# Patient Record
Sex: Female | Born: 2000 | Race: White | Hispanic: No | Marital: Single | Smoking: Never smoker
Health system: Southern US, Community
[De-identification: ages and names within clinical notes are randomized; demographics above are authoritative.]

## PROBLEM LIST (undated history)

## (undated) DIAGNOSIS — F32A Depression, unspecified: Secondary | ICD-10-CM

## (undated) DIAGNOSIS — F419 Anxiety disorder, unspecified: Secondary | ICD-10-CM

## (undated) HISTORY — PX: NO PAST SURGERIES: SHX2092

---
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
## 2021-08-14 ENCOUNTER — Ambulatory Visit: Payer: Medicaid Other | Admitting: Cardiology

## 2021-08-14 ENCOUNTER — Encounter: Payer: Self-pay | Admitting: Cardiology

## 2021-08-14 ENCOUNTER — Other Ambulatory Visit: Payer: Self-pay

## 2021-08-14 VITALS — BP 131/83 | HR 112 | Temp 98.3°F | Resp 16 | Ht 61.0 in | Wt 84.8 lb

## 2021-08-14 DIAGNOSIS — R000 Tachycardia, unspecified: Secondary | ICD-10-CM

## 2021-08-14 NOTE — Progress Notes (Signed)
Primary Physician/Referring:  Trey Sailors, PA  Patient ID: Cassandra Cruz, female    DOB: 04-04-2001, 20 y.o.   MRN: [id number]  Chief Complaint  Patient presents with   New Patient (Initial Visit)   Tachycardia   Palpitations   HPI:    Cassandra Cruz  is a 20 y.o. Caucasian female referred to me for evaluation of resting sinus tachycardia.  Patient is asymptomatic.  Physical examination does not reveal any obvious abnormalities, EKG does reveal heart rate of 99 bpm.  She is asymptomatic.  Past Medical History:  Diagnosis Date   Anxiety    Past Surgical History:  Procedure Laterality Date   NO PAST SURGERIES     STRABISMUS SURGERY Bilateral 04/08/2020   Procedure: BILATERAL STRABISMUS REPAIR PEDIATRIC;  Surgeon: Everitt Amber, MD;  Location: [place];  Service: Ophthalmology;  Laterality: Bilateral;   Family History  Problem Relation Age of Onset   Anxiety disorder Mother    Depression Mother    Post-traumatic stress disorder Mother    Anxiety disorder Father    Depression Father    Supraventricular tachycardia Sister    Anemia Sister    Asthma Brother    Anxiety disorder Maternal Aunt    Depression Maternal Aunt    Anxiety disorder Paternal Aunt    Depression Paternal Aunt    Anxiety disorder Maternal Grandmother    Depression Maternal Grandmother    Anxiety disorder Maternal Grandfather    Depression Maternal Grandfather    Anxiety disorder Paternal Grandmother     Social History   Tobacco Use   Smoking status: Never   Smokeless tobacco: Never  Substance Use Topics   Alcohol use: Never   Marital Status: Single  ROS  Review of Systems  Cardiovascular:  Negative for chest pain, dyspnea on exertion and leg swelling.  Gastrointestinal:  Negative for melena.  Objective  Blood pressure 131/83, pulse (!) 112, temperature 98.3 F (36.8 C), temperature source Temporal, resp. rate 16, height _0  (1.549 m), weight 84 lb 12.8 oz (38.5  kg), SpO2 98 %. Body mass index is 16.02 kg/m.  Vitals with BMI 08/14/2021 04/08/2020 04/08/2020  Height _1  - -  Weight 84 lbs 13 oz - -  BMI 23.36 - -  Systolic 122 449 753  Diastolic 83 73 69  Pulse 005 86 93  Some encounter information is confidential and restricted. Go to Review Flowsheets activity to see all data.    Physical Exam Constitutional:      Appearance: She is underweight.  Neck:     Vascular: No carotid bruit or JVD.  Cardiovascular:     Rate and Rhythm: Normal rate and regular rhythm.     Pulses: Intact distal pulses.     Heart sounds: Normal heart sounds. No murmur heard.   No gallop.  Pulmonary:     Effort: Pulmonary effort is normal.     Breath sounds: Normal breath sounds.  Abdominal:     General: Bowel sounds are normal.     Palpations: Abdomen is soft.  Musculoskeletal:        General: No swelling.     Laboratory examination:   No results for input(s): NA, K, CL, CO2, GLUCOSE, BUN, CREATININE, CALCIUM, GFRNONAA, GFRAA in the last 8760 hours. CrCl cannot be calculated (No successful lab value found.).  No flowsheet data found. No flowsheet data found.  Lipid Panel No results for input(s): CHOL, TRIG, LDLCALC, VLDL, HDL, CHOLHDL, LDLDIRECT in the last  8760 hours. Lipid Panel  No results found for: CHOL, TRIG, HDL, CHOLHDL, VLDL, LDLCALC, LDLDIRECT, LABVLDL   HEMOGLOBIN A1C No results found for: HGBA1C, MPG TSH No results for input(s): TSH in the last 8760 hours.  External labs:   08/02/2021:  Serum glucose 82 mg, BUN 10, creatinine 0.62, EGFR 131 mL, potassium 4.8, CMP otherwise normal.  Hb 12.2/HCT 38.5, platelets 497, elevated.  ANA negative.  Vitamin D 63.  Total cholesterol 156, triglycerides 63, HDL 57, LDL 85.  TSH 4.8, normal, FT4 normal.  Medications and allergies  No Known Allergies   Medication prior to this encounter:   No outpatient medications prior to visit.   No facility-administered medications prior to visit.     Medication list after today's encounter   No current outpatient medications  Radiology:   No results found.  Cardiac Studies:   EKG:   EKG 08/14/2021: Normal sinus rhythm at rate of 99 bpm, incomplete right bundle.  Normal EKG.    Assessment     ICD-10-CM   1. Sinus tachycardia  R00.0 EKG 12-Lead       There are no discontinued medications.  No orders of the defined types were placed in this encounter.  Orders Placed This Encounter  Procedures   EKG 12-Lead   Recommendations:   Cassandra Cruz is a 20 y.o. Caucasian female referred to me for evaluation of resting sinus tachycardia.  Patient is asymptomatic.  Physical examination does not reveal any obvious abnormalities, EKG does reveal heart rate of 99 bpm.  On further questioning, patient has been drinking caffeinated sodas frequently and also coffee frequently which she has completely quit since she was told she has sinus tachycardia.  Heart rate is improved.  In general patient has very low BMI, her thyroid panel is normal, she could try cyproheptadine for increasing her appetite.  Do not think she needs any further cardiac work-up.  Her mother has very mild coronary artery disease, but she is a smoker.  Patient's labs reviewed, she does have mildly elevated platelets but otherwise unremarkable labs including lipid profile.  For now continued observation is indicated.    Adrian Prows, MD, [facility] 08/14/2021, 12:45 PM Office: [phone]

## 2023-09-21 ENCOUNTER — Emergency Department (HOSPITAL_COMMUNITY)
Admission: EM | Admit: 2023-09-21 | Discharge: 2023-09-22 | Disposition: A | Discharge status: Other Acute Inpatient Hospital | Payer: Medicaid Other | Attending: Emergency Medicine | Admitting: Emergency Medicine

## 2023-09-21 ENCOUNTER — Encounter (HOSPITAL_COMMUNITY): Payer: Self-pay | Admitting: *Deleted

## 2023-09-21 ENCOUNTER — Other Ambulatory Visit: Payer: Self-pay

## 2023-09-21 DIAGNOSIS — R45851 Suicidal ideations: Secondary | ICD-10-CM | POA: Insufficient documentation

## 2023-09-21 DIAGNOSIS — F329 Major depressive disorder, single episode, unspecified: Secondary | ICD-10-CM | POA: Diagnosis present

## 2023-09-21 DIAGNOSIS — F332 Major depressive disorder, recurrent severe without psychotic features: Secondary | ICD-10-CM | POA: Insufficient documentation

## 2023-09-21 DIAGNOSIS — R000 Tachycardia, unspecified: Secondary | ICD-10-CM | POA: Diagnosis not present

## 2023-09-21 DIAGNOSIS — F411 Generalized anxiety disorder: Secondary | ICD-10-CM | POA: Diagnosis not present

## 2023-09-21 DIAGNOSIS — F909 Attention-deficit hyperactivity disorder, unspecified type: Secondary | ICD-10-CM | POA: Insufficient documentation

## 2023-09-21 DIAGNOSIS — T1491XA Suicide attempt, initial encounter: Secondary | ICD-10-CM

## 2023-09-21 LAB — COMPREHENSIVE METABOLIC PANEL
ALT: 18 U/L (ref 0–44)
AST: 24 U/L (ref 15–41)
Albumin: 4.3 g/dL (ref 3.5–5.0)
Alkaline Phosphatase: 148 U/L — ABNORMAL HIGH (ref 38–126)
Anion gap: 12 (ref 5–15)
BUN: 10 mg/dL (ref 6–20)
CO2: 22 mmol/L (ref 22–32)
Calcium: 9.2 mg/dL (ref 8.9–10.3)
Chloride: 104 mmol/L (ref 98–111)
Creatinine, Ser: 0.76 mg/dL (ref 0.44–1.00)
GFR, Estimated: >60 mL/min (ref >60)
Glucose, Bld: 131 mg/dL — ABNORMAL HIGH (ref 70–99)
Potassium: 3.3 mmol/L — ABNORMAL LOW (ref 3.5–5.1)
Sodium: 138 mmol/L (ref 135–145)
Total Bilirubin: 0.6 mg/dL (ref 0.0–1.2)
Total Protein: 7.9 g/dL (ref 6.5–8.1)

## 2023-09-21 LAB — CBC WITH DIFFERENTIAL/PLATELET
Abs Immature Granulocytes: 0.13 10*3/uL — ABNORMAL HIGH (ref 0.00–0.07)
Basophils Absolute: 0.1 10*3/uL (ref 0.0–0.1)
Basophils Relative: 1 %
Eosinophils Absolute: 0.1 10*3/uL (ref 0.0–0.5)
Eosinophils Relative: 1 %
HCT: 38.4 % (ref 36.0–46.0)
Hemoglobin: 11.6 g/dL — ABNORMAL LOW (ref 12.0–15.0)
Immature Granulocytes: 1 %
Lymphocytes Relative: 15 %
Lymphs Abs: 1.5 10*3/uL (ref 0.7–4.0)
MCH: 26.2 pg (ref 26.0–34.0)
MCHC: 30.2 g/dL (ref 30.0–36.0)
MCV: 86.7 fL (ref 80.0–100.0)
Monocytes Absolute: 0.5 10*3/uL (ref 0.1–1.0)
Monocytes Relative: 5 %
Neutro Abs: 7.6 10*3/uL (ref 1.7–7.7)
Neutrophils Relative %: 77 %
Platelets: 606 10*3/uL — ABNORMAL HIGH (ref 150–400)
RBC: 4.43 MIL/uL (ref 3.87–5.11)
RDW: 16.9 % — ABNORMAL HIGH (ref 11.5–15.5)
WBC: 9.9 10*3/uL (ref 4.0–10.5)
nRBC: 0 % (ref 0.0–0.2)

## 2023-09-21 LAB — RAPID URINE DRUG SCREEN, HOSP PERFORMED
Amphetamines: NOT DETECTED
Barbiturates: NOT DETECTED
Benzodiazepines: NOT DETECTED
Cocaine: NOT DETECTED
Opiates: NOT DETECTED
Tetrahydrocannabinol: NOT DETECTED

## 2023-09-21 LAB — URINALYSIS, ROUTINE W REFLEX MICROSCOPIC
Bilirubin Urine: NEGATIVE
Glucose, UA: NEGATIVE mg/dL
Ketones, ur: 5 mg/dL — AB
Nitrite: NEGATIVE
Protein, ur: 100 mg/dL — AB
Specific Gravity, Urine: 1.02 (ref 1.005–1.030)
pH: 5 (ref 5.0–8.0)

## 2023-09-21 LAB — CBG MONITORING, ED: Glucose-Capillary: 103 mg/dL — ABNORMAL HIGH (ref 70–99)

## 2023-09-21 LAB — OSMOLALITY: Osmolality: 318 mosm/kg — ABNORMAL HIGH (ref 275–295)

## 2023-09-21 LAB — SALICYLATE LEVEL: Salicylate Lvl: <7 mg/dL — ABNORMAL LOW (ref 7.0–30.0)

## 2023-09-21 LAB — ACETAMINOPHEN LEVEL
Acetaminophen (Tylenol), Serum: <10 ug/mL — ABNORMAL LOW (ref 10–30)
Acetaminophen (Tylenol), Serum: <10 ug/mL — ABNORMAL LOW (ref 10–30)

## 2023-09-21 LAB — ETHANOL: Alcohol, Ethyl (B): <10 mg/dL (ref <10)

## 2023-09-21 LAB — MAGNESIUM: Magnesium: 2.2 mg/dL (ref 1.7–2.4)

## 2023-09-21 LAB — HCG, SERUM, QUALITATIVE: Preg, Serum: NEGATIVE

## 2023-09-21 MED ORDER — SODIUM CHLORIDE 0.9 % IV BOLUS
1000.0000 mL | Freq: Once | INTRAVENOUS | Status: AC
  Administered 2023-09-21: 1000 mL via INTRAVENOUS

## 2023-09-21 MED ORDER — POTASSIUM CHLORIDE 10 MEQ/100ML IV SOLN
10.0000 meq | INTRAVENOUS | Status: AC
Start: 2023-09-21 — End: 2023-09-21
  Administered 2023-09-21 (×2): 10 meq via INTRAVENOUS
  Filled 2023-09-21: qty 100

## 2023-09-21 MED ORDER — HYDROXYZINE HCL 25 MG PO TABS
25.0000 mg | ORAL_TABLET | Freq: Three times a day (TID) | ORAL | Status: DC | PRN
Start: 2023-09-21 — End: 2023-09-22

## 2023-09-21 MED ORDER — ALUM & MAG HYDROXIDE-SIMETH 200-200-20 MG/5ML PO SUSP: 30.0000 mL | Freq: Four times a day (QID) | ORAL | Status: DC | PRN

## 2023-09-21 NOTE — ED Notes (Signed)
Recommended for inpt psych for MDD, see [facility] BH/MH note

## 2023-09-21 NOTE — ED Notes (Signed)
Mother Blair Heys [phone]  has pts belongings and would like updates

## 2023-09-21 NOTE — ED Notes (Signed)
Pt would like mother to get updates on her care

## 2023-09-21 NOTE — ED Triage Notes (Addendum)
BIB [facility] from home, lives with family, here for SI w/ attempt of drinking rubbing alcohol, with subsequent abd pain, NV. VSS. HR ST 130.CBG 183. GCS 15. Endorses SI and attempt. Awake, interactive, calm, follows commands. Emesis noted at home, NV with EMS enroute. No IV, no meds given. Protecting airway.

## 2023-09-21 NOTE — Consult Note (Signed)
[facility] Psychiatric Consult Initial  Patient Name: .Nakirah Silverstone  MRN: [id number]  DOB: 2001/07/18  Consult Order details:  Orders (From admission, onward)     Start     Ordered   09/21/23 1721  CONSULT TO CALL ACT TEAM       Ordering Provider: Smitty Knudsen, PA-C  Provider:  (Not yet assigned)  Question:  Reason for Consult?  Answer:  Psych consult, SI with attempt   09/21/23 1722             Mode of Visit: In person    Psychiatry Consult Evaluation  Service Date: September 21, 2023 LOS:  LOS: 0 days  Chief Complaint SI attempt  Primary Psychiatric Diagnoses  Major Depressive disorder 2.   Generalized anxiety disorder 3.   ADHD  Assessment  Trillium Gunnison is a 23 y.o. female admitted: Presented to the ED for 09/21/2023 12:03 PM for suicide attempt. She carries the psychiatric diagnoses of major depressive disorder, anxiety disorder, ADHD and has a past medical history of none.   Her current presentation of suicide attempt, due to racing thoughts is most consistent with depression and anxiety. She meets criteria for inpatient based on suicide attempt.  Current outpatient psychotropic medications include none. On initial examination, patient sitting up in hospital gurney, pleasant and cooperative. Please see plan below for detailed recommendations.   Diagnoses:  Active Hospital problems: Principal Problem:   GAD (generalized anxiety disorder) Active Problems:   MDD (major depressive disorder)    Plan   ## Psychiatric Medication Recommendations:  Start patient on Atarax 25 mg 3 times daily as needed for anxiety  ## Medical Decision Making Capacity:  Patient is her own legal guardian  ## Further Work-up:  -- Monitor potassium level EKG or UDS -- most recent EKG on 09/21/23 had QtC of 463 -- Pertinent labwork reviewed earlier this admission includes: CMP, BMP, UDS, EKG, K+   ## Disposition:-- We recommend inpatient psychiatric hospitalization after  medical hospitalization. Patient has been involuntarily committed on 09/21/23.   ## Behavioral / Environmental: -To minimize splitting of staff, assign one staff person to communicate all information from the team when feasible. or Utilize compassion and acknowledge the patient's experiences while setting clear and realistic expectations for care.    ## Safety and Observation Level:  - Based on my clinical evaluation, I estimate the patient to be at high risk of self harm in the current setting. - At this time, we recommend  1:1 Observation. This decision is based on my review of the chart including patient's history and current presentation, interview of the patient, mental status examination, and consideration of suicide risk including evaluating suicidal ideation, plan, intent, suicidal or self-harm behaviors, risk factors, and protective factors. This judgment is based on our ability to directly address suicide risk, implement suicide prevention strategies, and develop a safety plan while the patient is in the clinical setting. Please contact our team if there is a concern that risk level has changed.  CSSR Risk Category:C-SSRS RISK CATEGORY: High Risk  Suicide Risk Assessment: Patient has following modifiable risk factors for suicide: active suicidal ideation, untreated depression, social isolation, and medication noncompliance, which we are addressing by recommending inpatient psychiatric admission. Patient has following non-modifiable or demographic risk factors for suicide: history of suicide attempt and history of self harm behavior Patient has the following protective factors against suicide: Supportive family and Minor children in the home  Thank you for this consult request. Recommendations have been  communicated to the primary team.  We will recommend inpatient psychiatric admission and continue to follow the patient at this time.   Alona Bene, PMHNP       History of  Present Illness  Relevant Aspects of Hospital ED Course:  Admitted on 09/21/2023 for suicidal attempt.  Patient Report:  [name], 23 y.o., female patient seen face to face by this provider, consulted with Dr. Jannifer Franklin; and chart reviewed on 09/21/23.  On evaluation Emanee Tarricone reports  that she has been feeling suicidal for a long time.  She states sometimes her thoughts get the best of her, states he has been suffering from anxiety since the age of 23 years old.  States that she has started on medications was on Prozac at age 25 but only took it for a few months, in 2021 was prescribed the present medication but never took them.  She states she does not like the idea of being on medications that she did not want to start them at that time, now she says she is open to trying new medications for her increasing racing anxious thoughts.  She states she has no good days from her racing thoughts and her anxiety, no distractions help her that she has tried, states she is tired of feeling this way, so she attempted to drink rubbing alcohol and attempt to harm herself.  Patient states that she does have a history of suicide attempts states she tried to take 100 ibuprofen at the end of December 2024, but vomited them back up shortly after, did not go to a hospital or see anyone about it.  She also states that she has a history of self-harm in January 2024, where she has superficial scratches on her arms and thighs.  Patient states she has not been admitted to an inpatient psychiatric facility.  States she did receive counseling and [facility] [place], but states she did not open up and really discuss her feelings and emotions, left the practice shortly after.  She states she currently lives with her family which consists of mom, stepdad,  grandfather, older  sister, younger brother.  She states her biological father passed away years  ago due to a drug overdose.  She works part-time at [employer], she does not drive due to her anxiety.  Patient states she was beginning to have an alcohol problem, but her ex-boyfriend stopped her from drinking excessively, states she has not had any alcohol since beginning of December 2024, states she has not had any marijuana since January 2024.  UDS was negative and BAL less than 10.  She states that her appetite is good she is about 2 meals and snacks a day, her sleep she receives about 6 to 8 hours a night with no medication.  She endorses suicidal ideations with no current plan and denies HI/AVH.  She states her maternal side of the family has a lot of mental health issues.  During evaluation Kameka Bream is sitting on her hospital gurney, hair is in her face so this provider cannot see her face, she appears to be very shy but does not appear to be in any distress. She is alert, oriented x 4, calm, cooperative and attentive. Her mood is sad with congruent/flat affect. She has normal speech, and behavior.  Objectively there is no evidence of psychosis/mania or delusional thinking.  Patient is able to converse coherently, goal directed thoughts, no distractibility, or pre-occupation.  She endorses suicidal ideations with no current plan and denies HI/AVH.   Psych ROS:  Depression: Yes Anxiety: Yes Mania (lifetime and current): Denies Psychosis: (lifetime and current): Denies  Collateral information:  Attempts to contact Blair Heys patient's mother no answer we will try again.  Review of Systems  Psychiatric/Behavioral:  Positive for depression and suicidal ideas. The patient is nervous/anxious.      Psychiatric and Social History  Psychiatric History:  Information collected from patient  Prev Dx/Sx: Depression and anxiety, when younger ADHD Current Psych Provider: None Home Meds (current): None Previous Med Trials: Yes, Prozac and other antidepressants Therapy: Yes in  2021  Prior Psych Hospitalization: No Prior Self Harm: Yes Prior Violence: Denies  Family Psych History: Yes maternal side of family Family Hx suicide: Unknown  Social History:  Developmental Hx: Patient appears age appropriate Educational Hx: Patient graduated from high school Occupational Hx: Works at [employer] part-time Legal Hx: Denies Living Situation: Lives with family Spiritual Hx: Christian Access to weapons/lethal means: Patient denies  Substance History Alcohol: Yes Type of alcohol liquor Last Drink August 04, 2023 Number of drinks per day she can remember History of alcohol withdrawal seizures denies History of DT's denies Tobacco: Denies Illicit drugs: Denies Prescription drug abuse: Denies Rehab hx: Denies  Exam Findings  Physical Exam:  Vital Signs:  Temp:  [97.7 F (36.5 C)-98.4 F (36.9 C)] 98.4 F (36.9 C) (01/18 1630) Pulse Rate:  [97-132] 123 (01/18 1615) Resp:  [8-28] 15 (01/18 1615) BP: (98-128)/(55-104) 107/62 (01/18 1615) SpO2:  [95 %-100 %] 95 % (01/18 1615) Weight:  [38.5 kg] 38.5 kg (01/18 1219) Blood pressure 107/62, pulse (!) 123, temperature 98.4 F (36.9 C), temperature source Oral, resp. rate 15, height 5\' 1"  (1.549 m), weight 38.5 kg, last menstrual period 08/28/2023, SpO2 95%. Body mass index is 16.02 kg/m.  Physical Exam Vitals and nursing note reviewed. Exam conducted with a chaperone present.  Psychiatric:        Attention and Perception: Attention normal.        Mood and Affect: Mood is anxious. Affect is flat and tearful.        Speech: Speech normal.        Behavior: Behavior is cooperative.        Thought Content: Thought content includes suicidal ideation. Thought content includes suicidal plan.        Cognition and Memory: Memory normal.        Judgment: Judgment is impulsive and inappropriate.     Mental Status Exam: General Appearance: Disheveled  Orientation:  Full (Time, Place, and Person)  Memory:   Immediate;   Good Remote;   Good  Concentration:  Concentration: Fair and Attention Span: Fair  Recall:  Fair  Attention  Good  Eye Contact:  Good  Speech:  Clear and Coherent  Language:  Good  Volume:  Decreased  Mood: sad  Affect:  Congruent  Thought Process:  Coherent  Thought Content:  WDL  Suicidal Thoughts:  Yes.  with intent/plan  Homicidal Thoughts:  No  Judgement:  Fair  Insight:  Fair  Psychomotor Activity:  Normal  Akathisia:  No  Fund of Knowledge:  Fair      Assets:  Manufacturing systems engineer Desire for Improvement Financial Resources/Insurance Housing Social Support  Cognition:  WNL  ADL's:  Intact  AIMS (if indicated):        Other History   These have been pulled in through the EMR, reviewed, and updated if  appropriate.  Family History:  The patient's family history includes Anemia in her sister; Anxiety disorder in her father, maternal aunt, maternal grandfather, maternal grandmother, mother, paternal aunt, and paternal grandmother; Asthma in her brother; Depression in her father, maternal aunt, maternal grandfather, maternal grandmother, mother, and paternal aunt; Post-traumatic stress disorder in her mother; Supraventricular tachycardia in her sister.  Medical History: Past Medical History:  Diagnosis Date   Anxiety     Surgical History: Past Surgical History:  Procedure Laterality Date   NO PAST SURGERIES     STRABISMUS SURGERY Bilateral 04/08/2020   Procedure: BILATERAL STRABISMUS REPAIR PEDIATRIC;  Surgeon: Verne Carrow, MD;  Location: [facility];  Service: Ophthalmology;  Laterality: Bilateral;     Medications:   Current Facility-Administered Medications:    alum & mag hydroxide-simeth (MAALOX/MYLANTA) 200-200-20 MG/5ML suspension 30 mL, 30 mL, Oral, Q6H PRN, Barrett, Jamie N, PA-C No current outpatient medications on file.  Allergies: No Known Allergies  Travarus Trudo MOTLEY-MANGRUM, PMHNP

## 2023-09-21 NOTE — ED Notes (Signed)
TTS in progress at [facility]

## 2023-09-21 NOTE — ED Notes (Signed)
Poison control called back and was satisfied with the patients progress. Said she was clear from their standpoint.

## 2023-09-21 NOTE — ED Notes (Signed)
ED PA at [facility] 

## 2023-09-21 NOTE — ED Provider Notes (Signed)
[place] EMERGENCY DEPARTMENT AT [facility] Provider Note   CSN: [id number] Arrival date & time: 09/21/23  1202     History  Chief Complaint  Patient presents with   Suicide Attempt    Cassandra Cruz is a 23 y.o. female with history of anxiety and depression reporting to the emergency room with attempted suicide.  Patient reports that she drank half a bottle of rubbing alcohol around 930 this morning.  Patient reports she did not take any other medications.  She reports she had history of suicide attempt in the past however did not seek care after attempt.  Endorses nausea.  Reports she vomited multiple times at home prior to arriving to emergency room.  Denies any chest pain, shortness of breath or abdominal pain.  HPI     Home Medications Prior to Admission medications   Not on File      Allergies    Patient has no known allergies.    Review of Systems   Review of Systems  Gastrointestinal:  Positive for vomiting.    Physical Exam Updated Vital Signs BP 101/61   Pulse 100   Temp 97.7 F (36.5 C) (Oral)   Resp 17   Ht 5\' 1"  (1.549 m)   Wt 38.5 kg   LMP 08/28/2023 (Approximate)   SpO2 100%   BMI 16.02 kg/m  Physical Exam Vitals and nursing note reviewed.  Constitutional:      General: She is not in acute distress.    Appearance: She is not toxic-appearing.     Comments: Drowsy, but alert and oriented x 4.   HENT:     Head: Normocephalic and atraumatic.  Eyes:     General: No scleral icterus.    Conjunctiva/sclera: Conjunctivae normal.  Cardiovascular:     Rate and Rhythm: Regular rhythm. Tachycardia present.     Pulses: Normal pulses.     Heart sounds: Normal heart sounds.  Pulmonary:     Effort: Pulmonary effort is normal. No respiratory distress.     Breath sounds: Normal breath sounds.  Abdominal:     General: Abdomen is flat. Bowel sounds are normal. There is no distension.     Palpations: Abdomen is soft. There is no mass.      Tenderness: There is no abdominal tenderness.  Musculoskeletal:     Right lower leg: No edema.     Left lower leg: No edema.  Skin:    General: Skin is warm and dry.     Capillary Refill: Capillary refill takes less than 2 seconds.     Findings: No lesion.  Neurological:     General: No focal deficit present.     Mental Status: She is alert and oriented to person, place, and time. Mental status is at baseline.     Cranial Nerves: No cranial nerve deficit.     Sensory: No sensory deficit.     Motor: No weakness.     Coordination: Coordination normal.     Gait: Gait normal.     ED Results / Procedures / Treatments   Labs (all labs ordered are listed, but only abnormal results are displayed) Labs Reviewed  COMPREHENSIVE METABOLIC PANEL - Abnormal; Notable for the following components:      Result Value   Potassium 3.3 (*)    Glucose, Bld 131 (*)    Alkaline Phosphatase 148 (*)    All other components within normal limits  CBC WITH DIFFERENTIAL/PLATELET - Abnormal; Notable for the following  components:   Hemoglobin 11.6 (*)    RDW 16.9 (*)    Platelets 606 (*)    Abs Immature Granulocytes 0.13 (*)    All other components within normal limits  SALICYLATE LEVEL - Abnormal; Notable for the following components:   Salicylate Lvl <7.0 (*)    All other components within normal limits  ACETAMINOPHEN LEVEL - Abnormal; Notable for the following components:   Acetaminophen (Tylenol), Serum <10 (*)    All other components within normal limits  CBG MONITORING, ED - Abnormal; Notable for the following components:   Glucose-Capillary 103 (*)    All other components within normal limits  ETHANOL  MAGNESIUM  RAPID URINE DRUG SCREEN, HOSP PERFORMED  HCG, SERUM, QUALITATIVE  URINALYSIS, ROUTINE W REFLEX MICROSCOPIC  OSMOLALITY    EKG EKG Interpretation Date/Time:  Saturday September 21 2023 12:17:06 EST Ventricular Rate:  104 PR Interval:  147 QRS Duration:  88 QT  Interval:  399 QTC Calculation: 525 R Axis:   79  Text Interpretation: Sinus tachycardia Right atrial enlargement RSR' in V1 or V2, probably normal variant Borderline T wave abnormalities Prolonged QT interval No prior for comparison Confirmed by Vivi Barrack [phone]) on 09/21/2023 12:52:04 PM  Radiology No results found.  Procedures Procedures    Medications Ordered in ED Medications  sodium chloride 0.9 % bolus 1,000 mL (has no administration in time range)  potassium chloride 10 mEq in 100 mL IVPB (has no administration in time range)    ED Course/ Medical Decision Making/ A&P Clinical Course as of 09/21/23 1759  Sat Sep 21, 2023  1233 4 hrs repeat Acet, repeat EKG per poison control  [JB]  1552 Reassessed.  Improved presentation.  Patient no longer drowsy.  Alert and oriented answering questions appropriately.  Lungs clear to auscultation.  No abdominal pain.  Denies any nausea. [JB]    Clinical Course User Index [JB] Tabby Beaston, Horald Chestnut, PA-C                                 Medical Decision Making Amount and/or Complexity of Data Reviewed Labs: ordered.  Risk OTC drugs. Prescription drug management.   This patient presents to the ED for concern of SI after suicide  attempt, this involves an extensive number of treatment options, and is a complaint that carries with it a high risk of complications and morbidity.  The differential diagnosis includes anxiety, depression, suicide attempt, suicidal ideation, electrolyte abnormality, dehydration, medication side effect   Co morbidities that complicate the patient evaluation  Anxiety    Lab Tests:  I personally interpreted labs.  The pertinent results include:   CBC without leukocytosis, hemoglobin 11.6.  CMP with potassium of 3.3, no anion gap, alk phos is 148.  Magnesium is 2.2, salicylate under 7, acetaminophen under 10, ethanol under 10, negative hCG, normal blood glucose, negative urine rapid drug screen.  UA with  small amount of ketones, does have some amount of leukocytes and bacteria but denies dysuria or urinary tract symptoms. Repeating acetaminophen at 430 as suggested by poison control.     Cardiac Monitoring: / EKG:  The patient was maintained on a cardiac monitor.  I personally viewed and interpreted the cardiac monitored which showed an underlying rhythm of: sinus tachycardia, prolonged QT    Consultations Obtained:  I requested consultation with the BH,  and discussed lab and imaging findings as well as pertinent plan - they  recommend: Inpatient psych stay    Problem List / ED Course / Critical interventions / Medication management  Patient reporting to emergency room with intentional overdose attempt.  Patient reports that she had nausea and vomiting at home.  Upon arriving to emergency room she is tachycardic.  Patient reports she has history of chronic tachycardia and has been referred to cardiology for extensive workup for this.  Labs reassuring patient.  EKG does show sinus tachycardia with prolonged QT.  Will monitor closely and reassess including repeating EKG. On reassessment patient reports she is not having chest pain, shortness of breath, abdominal pain.  She does report she is having some nausea but declines any medication.  Has not had any episodes of vomiting since arriving to emergency room.  Continues to have sinus tachycardia.  Will recheck acetaminophen level.  She currently has no complaints and has had improvement in drowsiness.  Repeat acetaminophen within normal limits.  Repeat EKG shows sinus tachycardia at 105.  And no longer having QT prolongation.  Patient has been observed in the emergency room for 6 hours as suggested by poison control, medically cleared for [facility] evaluation.  I ordered medication including normal saline, potassium Reevaluation of the patient after these medicines showed that the patient improved I have reviewed the patients home medicines and have  made adjustments as needed   Plan  Patient under IVC for SI Diet ordered.  Patient has no at home medications. Admit for suicide attempt, suicidal ideation        Final Clinical Impression(s) / ED Diagnoses Final diagnoses:  Suicide attempt [facility])    Rx / DC Orders ED Discharge Orders     None         Smitty Knudsen, PA-C 09/21/23 1856    Loetta Rough, MD 09/22/23 [phone]

## 2023-09-21 NOTE — ED Notes (Signed)
EDP at BS 

## 2023-09-22 ENCOUNTER — Inpatient Hospital Stay (HOSPITAL_COMMUNITY)
Admission: AD | Admit: 2023-09-22 | Discharge: 2023-09-30 | DRG: 885 | Disposition: A | Discharge status: Home / Self Care | Payer: Medicaid Other | Source: Intra-hospital | Attending: Psychiatry | Admitting: Psychiatry

## 2023-09-22 ENCOUNTER — Encounter (HOSPITAL_COMMUNITY): Payer: Self-pay | Admitting: Nurse Practitioner

## 2023-09-22 ENCOUNTER — Other Ambulatory Visit: Payer: Self-pay

## 2023-09-22 DIAGNOSIS — F332 Major depressive disorder, recurrent severe without psychotic features: Principal | ICD-10-CM | POA: Diagnosis present

## 2023-09-22 DIAGNOSIS — T50902A Poisoning by unspecified drugs, medicaments and biological substances, intentional self-harm, initial encounter: Secondary | ICD-10-CM | POA: Diagnosis not present

## 2023-09-22 DIAGNOSIS — F909 Attention-deficit hyperactivity disorder, unspecified type: Secondary | ICD-10-CM | POA: Diagnosis present

## 2023-09-22 DIAGNOSIS — I951 Orthostatic hypotension: Secondary | ICD-10-CM | POA: Diagnosis not present

## 2023-09-22 DIAGNOSIS — F4010 Social phobia, unspecified: Secondary | ICD-10-CM | POA: Diagnosis present

## 2023-09-22 DIAGNOSIS — Z818 Family history of other mental and behavioral disorders: Secondary | ICD-10-CM

## 2023-09-22 DIAGNOSIS — F429 Obsessive-compulsive disorder, unspecified: Secondary | ICD-10-CM | POA: Insufficient documentation

## 2023-09-22 DIAGNOSIS — F4310 Post-traumatic stress disorder, unspecified: Secondary | ICD-10-CM | POA: Diagnosis present

## 2023-09-22 DIAGNOSIS — Z79899 Other long term (current) drug therapy: Secondary | ICD-10-CM | POA: Diagnosis not present

## 2023-09-22 DIAGNOSIS — Z825 Family history of asthma and other chronic lower respiratory diseases: Secondary | ICD-10-CM

## 2023-09-22 DIAGNOSIS — Z681 Body mass index (BMI) 19 or less, adult: Secondary | ICD-10-CM | POA: Diagnosis not present

## 2023-09-22 DIAGNOSIS — Z8659 Personal history of other mental and behavioral disorders: Secondary | ICD-10-CM

## 2023-09-22 DIAGNOSIS — Z9151 Personal history of suicidal behavior: Secondary | ICD-10-CM | POA: Diagnosis not present

## 2023-09-22 DIAGNOSIS — F411 Generalized anxiety disorder: Secondary | ICD-10-CM | POA: Diagnosis present

## 2023-09-22 DIAGNOSIS — T39312D Poisoning by propionic acid derivatives, intentional self-harm, subsequent encounter: Secondary | ICD-10-CM

## 2023-09-22 DIAGNOSIS — F410 Panic disorder [episodic paroxysmal anxiety] without agoraphobia: Secondary | ICD-10-CM | POA: Diagnosis present

## 2023-09-22 LAB — TSH: TSH: 0.773 u[IU]/mL (ref 0.350–4.500)

## 2023-09-22 LAB — VITAMIN D 25 HYDROXY (VIT D DEFICIENCY, FRACTURES): Vit D, 25-Hydroxy: 6.36 ng/mL — ABNORMAL LOW (ref 30–100)

## 2023-09-22 MED ORDER — HALOPERIDOL LACTATE 5 MG/ML IJ SOLN: 5.0000 mg | Freq: Three times a day (TID) | INTRAMUSCULAR | Status: DC | PRN

## 2023-09-22 MED ORDER — LORAZEPAM 2 MG/ML IJ SOLN: 2.0000 mg | Freq: Three times a day (TID) | INTRAMUSCULAR | Status: DC | PRN

## 2023-09-22 MED ORDER — PROPRANOLOL HCL 10 MG PO TABS
5.0000 mg | ORAL_TABLET | Freq: Once | ORAL | Status: AC
  Administered 2023-09-22: 5 mg via ORAL
  Filled 2023-09-22: qty 1
  Filled 2023-09-22: qty 0.5

## 2023-09-22 MED ORDER — DIPHENHYDRAMINE HCL 25 MG PO CAPS: 50.0000 mg | ORAL_CAPSULE | Freq: Three times a day (TID) | ORAL | Status: DC | PRN

## 2023-09-22 MED ORDER — HALOPERIDOL LACTATE 5 MG/ML IJ SOLN: 10.0000 mg | Freq: Three times a day (TID) | INTRAMUSCULAR | Status: DC | PRN

## 2023-09-22 MED ORDER — HYDROXYZINE HCL 25 MG PO TABS: 25.0000 mg | ORAL_TABLET | Freq: Three times a day (TID) | ORAL | Status: DC | PRN

## 2023-09-22 MED ORDER — PROPRANOLOL HCL 10 MG PO TABS: 5.0000 mg | ORAL_TABLET | Freq: Two times a day (BID) | ORAL | Status: DC

## 2023-09-22 MED ORDER — SERTRALINE HCL 25 MG PO TABS
25.0000 mg | ORAL_TABLET | Freq: Every day | ORAL | Status: AC
  Administered 2023-09-22: 25 mg via ORAL
  Filled 2023-09-22 (×2): qty 1

## 2023-09-22 MED ORDER — MAGNESIUM HYDROXIDE 400 MG/5ML PO SUSP: 30.0000 mL | Freq: Every day | ORAL | Status: DC | PRN

## 2023-09-22 MED ORDER — ALUM & MAG HYDROXIDE-SIMETH 200-200-20 MG/5ML PO SUSP: 30.0000 mL | ORAL | Status: DC | PRN

## 2023-09-22 MED ORDER — HALOPERIDOL 5 MG PO TABS: 5.0000 mg | ORAL_TABLET | Freq: Three times a day (TID) | ORAL | Status: DC | PRN

## 2023-09-22 MED ORDER — DIPHENHYDRAMINE HCL 50 MG/ML IJ SOLN: 50.0000 mg | Freq: Three times a day (TID) | INTRAMUSCULAR | Status: DC | PRN

## 2023-09-22 MED ORDER — ACETAMINOPHEN 325 MG PO TABS: 650.0000 mg | ORAL_TABLET | Freq: Four times a day (QID) | ORAL | Status: DC | PRN

## 2023-09-22 MED ORDER — TRAZODONE HCL 50 MG PO TABS
50.0000 mg | ORAL_TABLET | Freq: Every evening | ORAL | Status: DC | PRN
  Administered 2023-09-22 – 2023-09-27 (×2): 50 mg via ORAL
  Filled 2023-09-22 (×4): qty 1

## 2023-09-22 MED ORDER — PROPRANOLOL HCL 10 MG PO TABS
5.0000 mg | ORAL_TABLET | Freq: Two times a day (BID) | ORAL | Status: DC
  Administered 2023-09-23 – 2023-09-24 (×3): 5 mg via ORAL
  Filled 2023-09-22 (×4): qty 0.5
  Filled 2023-09-22: qty 1
  Filled 2023-09-22 (×3): qty 0.5

## 2023-09-22 MED ORDER — MIRTAZAPINE 7.5 MG PO TABS
7.5000 mg | ORAL_TABLET | Freq: Every day | ORAL | Status: DC
  Administered 2023-09-22 – 2023-09-23 (×2): 7.5 mg via ORAL
  Filled 2023-09-22 (×5): qty 1

## 2023-09-22 MED ORDER — SERTRALINE HCL 50 MG PO TABS
50.0000 mg | ORAL_TABLET | Freq: Every day | ORAL | Status: DC
  Administered 2023-09-23 – 2023-09-24 (×2): 50 mg via ORAL
  Filled 2023-09-22 (×5): qty 1

## 2023-09-22 NOTE — ED Notes (Signed)
[employer] called for transport 

## 2023-09-22 NOTE — Tx Team (Signed)
Initial Treatment Plan 09/22/2023 1:51 PM Cassandra Cruz [id number]    PATIENT STRESSORS: Marital or family conflict     PATIENT STRENGTHS: Motivation for treatment/growth  Supportive family/friends    PATIENT IDENTIFIED PROBLEMS: " My anxiety"  " My mood swings".                   DISCHARGE CRITERIA:  Improved stabilization in mood, thinking, and/or behavior Motivation to continue treatment in a less acute level of care Verbal commitment to aftercare and medication compliance  PRELIMINARY DISCHARGE PLAN: Outpatient therapy Return to previous living arrangement  PATIENT/FAMILY INVOLVEMENT: This treatment plan has been presented to and reviewed with the patient, Cassandra Cruz.  The patient  has been given the opportunity to ask questions and make suggestions.  Rosita Fire, RN 09/22/2023, 1:51 PM

## 2023-09-22 NOTE — Plan of Care (Signed)
  Problem: Education: Goal: Knowledge of [place] Education information/materials will improve Outcome: Progressing   Problem: Safety: Goal: Periods of time without injury will increase Outcome: Progressing   

## 2023-09-22 NOTE — [facility] Group Notes (Signed)
Psychoeducational Group Note  Date:  09/22/2023 Time:  2000  Group Topic/Focus:  Wrap up group  Participation Level: Did Not Attend  Participation Quality:  Not Applicable  Affect:  Not Applicable  Cognitive:  Not Applicable  Insight:  Not Applicable  Engagement in Group: Not Applicable  Additional Comments:  Did not attend.   Johann Capers S 09/22/2023, 10:12 PM

## 2023-09-22 NOTE — Progress Notes (Signed)
[facility] LCSW Progress Note   09/22/2023    9:53 AM  Cassandra Cruz   [id number]   Type of Contact and Topic:  Psychiatric Bed Placement   Pt accepted to [facility] 400-2    Patient meets inpatient criteria per Alona Bene, [facility]   The attending provider will be Dr. Sherron Flemings   Call report to [phone]    Mateo Flow, RN @ [facility] ED notified.     Pt scheduled  to arrive at [facility] TODAY. The bed is currently available.    Damita Dunnings, MSW, LCSW-A  9:54 AM 09/22/2023

## 2023-09-22 NOTE — Progress Notes (Addendum)
Patient is a 23 year old female admitted under IVC to unit  from [facility] ED post suicidal attempt via drinking 1/2 bottle of rubbing alcohol. Upon admission assessment, pt stated, " I've been having suicidal thoughts for a few months now so I drank rubbing alcohol because I was trying to kill myself". Pt also stated, " I have real bad anxiety and racing thoughts all the time I broke up with my boyfriend because the relationship was making it worse". Patient endorsed current suicidal thoughts with no plan and verbally agreed not to harm herself. Pt denied HI and AVH. Skin and safety check completed. Pt noted to have self inflicted healed cuts to her RLE and her LUE. No contraband found. Patient escorted on unit by RN and orientation provided with verbalization of understanding. Plan of Care initiated. Safety maintained.  09/22/23 1130  Psych Admission Type (Psych Patients Only)  Admission Status Involuntary  Psychosocial Assessment  Patient Complaints Anxiety;Depression  Eye Contact Brief  Facial Expression Flat;Sad  Affect Anxious;Depressed  Speech Logical/coherent  Interaction Assertive  Motor Activity Slow  Appearance/Hygiene In scrubs  Behavior Characteristics Cooperative;Anxious  Mood Depressed;Anxious  Thought Process  Coherency WDL  Content WDL  Delusions None reported or observed  Perception WDL  Hallucination None reported or observed  Judgment WDL  Confusion WDL  Danger to Self  Current suicidal ideation? Passive  Agreement Not to Harm Self Yes  Description of Agreement Verbal  Danger to Others  Danger to Others None reported or observed

## 2023-09-22 NOTE — H&P (Signed)
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 Term Goals: Ability to identify changes in lifestyle to reduce recurrence of condition will improve, Ability to verbalize feelings will improve, Ability to disclose and discuss suicidal ideas, Ability to demonstrate self-control will improve, Ability to identify and develop effective coping behaviors will improve, Ability to maintain clinical measurements within normal limits will improve, Compliance with prescribed medications will improve, and Ability to identify triggers associated with substance abuse/mental health issues will  improve             -- Long Term Goals: Improvement in symptoms so as ready for discharge                3. Medical Issues Being Addressed:              -- none     -- Continue PRN's: Tylenol, Maalox, Milk of Magnesia   4. Routine and other pertinent labs reviewed: EKG monitoring: QTc: 463 UDS: Negative Ethanol: Negative CBC: Hemoglobin 11.6 CMP: Mildly elevated alk phos 148 (probably physiologic, follow-up PCP), potassium 3.3 (corrected in ED) hCG serum: Negative acetaminophen: Negative multiple times Salicylate level: Negative Osmolality: 318   Metabolism / endocrine: BMI: There is no height or weight on file to calculate BMI. Prolactin: No results found for: "PROLACTIN" Lipid Panel: No results found for: "CHOL", "TRIG", "HDL", "CHOLHDL", "VLDL", "LDLCALC" HbgA1c: No results found for: "HGBA1C" TSH: No results found for: "TSH"  Labs to order: TSH, vitamin D (medical rule outs / inform treatment)  5. Discharge Planning:              -- Social work and case management to assist with discharge planning and identification of hospital follow-up needs prior to discharge             --  Estimated LOS: 09/29/23             -- Discharge Concerns: Need to establish a safety plan; Medication compliance and effectiveness             -- Discharge Goals: Return home with outpatient referrals for mental health follow-up including medication management/psychotherapy    I certify that inpatient services furnished can reasonably be expected to improve the patient's condition.   This note was created using a voice recognition software as a result there may be grammatical errors inadvertently enclosed that do not reflect the nature of this encounter. Every attempt is made to correct such errors.   Meryl Dare, MD PGY-1 Psychiatry Resident 09/22/2023, 1:17 PM

## 2023-09-22 NOTE — ED Notes (Signed)
Report given to [facility] at [facility]

## 2023-09-22 NOTE — Plan of Care (Signed)
  Problem: Education: Goal: Knowledge of [facility] Education information/materials will improve Outcome: Progressing Goal: Emotional status will improve Outcome: Progressing Goal: Mental status will improve Outcome: Progressing Goal: Verbalization of understanding the information provided will improve Outcome: Progressing   Problem: Activity: Goal: Interest or engagement in activities will improve Outcome: Progressing   

## 2023-09-22 NOTE — Progress Notes (Signed)
   09/22/23 2300  Psych Admission Type (Psych Patients Only)  Admission Status Involuntary  Psychosocial Assessment  Patient Complaints Anxiety;Depression;Isolation  Eye Contact Brief  Facial Expression Flat;Sad  Affect Anxious  Speech Logical/coherent  Interaction Assertive  Motor Activity Other (Comment) (WNL)  Appearance/Hygiene In scrubs  Behavior Characteristics Cooperative  Mood Depressed;Anxious  Thought Process  Coherency WDL  Content WDL  Delusions None reported or observed  Perception WDL  Hallucination None reported or observed  Judgment WDL  Confusion WDL  Danger to Self  Current suicidal ideation? Denies  Self-Injurious Behavior Obsessive/ruminative self-injurious and active non-lethal gesturing observed or expressed   Agreement Not to Harm Self Yes  Description of Agreement Verbal  Danger to Others  Danger to Others None reported or observed
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
## 2023-09-23 ENCOUNTER — Encounter (HOSPITAL_COMMUNITY): Payer: Self-pay

## 2023-09-23 DIAGNOSIS — Z8659 Personal history of other mental and behavioral disorders: Secondary | ICD-10-CM

## 2023-09-23 DIAGNOSIS — F429 Obsessive-compulsive disorder, unspecified: Secondary | ICD-10-CM | POA: Insufficient documentation

## 2023-09-23 MED ORDER — HYDROXYZINE HCL 25 MG PO TABS
25.0000 mg | ORAL_TABLET | Freq: Three times a day (TID) | ORAL | Status: DC
  Administered 2023-09-23 – 2023-09-24 (×2): 25 mg via ORAL
  Filled 2023-09-23 (×8): qty 1

## 2023-09-23 MED ORDER — VITAMIN D (ERGOCALCIFEROL) 1.25 MG (50000 UNIT) PO CAPS
50000.0000 [IU] | ORAL_CAPSULE | ORAL | Status: DC
  Administered 2023-09-23: 50000 [IU] via ORAL
  Filled 2023-09-23 (×2): qty 1

## 2023-09-23 MED ORDER — VITAMIN D3 25 MCG PO TABS
2000.0000 [IU] | ORAL_TABLET | Freq: Every day | ORAL | Status: DC
  Administered 2023-09-23 – 2023-09-30 (×8): 2000 [IU] via ORAL
  Filled 2023-09-23 (×9): qty 2

## 2023-09-23 NOTE — BH IP Treatment Plan (Signed)
Interdisciplinary Treatment and Diagnostic Plan Update  09/23/2023 Time of Session: 10:35AM Cassandra Cruz MRN: [id number]  Principal Diagnosis: MDD (major depressive disorder), recurrent severe, without psychosis (HCC)  Secondary Diagnoses: Principal Problem:   MDD (major depressive disorder), recurrent severe, without psychosis (HCC) Active Problems:   GAD (generalized anxiety disorder)   Intentional overdose (HCC)   OCD (obsessive compulsive disorder)   History of ADHD   Current Medications:  Current Facility-Administered Medications  Medication Dose Route Frequency Provider Last Rate Last Admin   acetaminophen (TYLENOL) tablet 650 mg  650 mg Oral Q6H PRN Bobbitt, Shalon E, NP       alum & mag hydroxide-simeth (MAALOX/MYLANTA) 200-200-20 MG/5ML suspension 30 mL  30 mL Oral Q4H PRN Bobbitt, Shalon E, NP       haloperidol (HALDOL) tablet 5 mg  5 mg Oral TID PRN Bobbitt, Shalon E, NP       And   diphenhydrAMINE (BENADRYL) capsule 50 mg  50 mg Oral TID PRN Bobbitt, Shalon E, NP       haloperidol lactate (HALDOL) injection 5 mg  5 mg Intramuscular TID PRN Bobbitt, Shalon E, NP       And   diphenhydrAMINE (BENADRYL) injection 50 mg  50 mg Intramuscular TID PRN Bobbitt, Shalon E, NP       And   LORazepam (ATIVAN) injection 2 mg  2 mg Intramuscular TID PRN Bobbitt, Shalon E, NP       haloperidol lactate (HALDOL) injection 10 mg  10 mg Intramuscular TID PRN Bobbitt, Shalon E, NP       And   diphenhydrAMINE (BENADRYL) injection 50 mg  50 mg Intramuscular TID PRN Bobbitt, Shalon E, NP       And   LORazepam (ATIVAN) injection 2 mg  2 mg Intramuscular TID PRN Bobbitt, Shalon E, NP       hydrOXYzine (ATARAX) tablet 25 mg  25 mg Oral TID PRN Bobbitt, Shalon E, NP       magnesium hydroxide (MILK OF MAGNESIA) suspension 30 mL  30 mL Oral Daily PRN Bobbitt, Shalon E, NP       mirtazapine (REMERON) tablet 7.5 mg  7.5 mg Oral QHS Meryl Dare, MD   7.5 mg at 09/22/23 2133   propranolol  (INDERAL) tablet 5 mg  5 mg Oral BID Meryl Dare, MD   5 mg at 09/23/23 1012   sertraline (ZOLOFT) tablet 50 mg  50 mg Oral Daily Meryl Dare, MD   50 mg at 09/23/23 1012   traZODone (DESYREL) tablet 50 mg  50 mg Oral QHS PRN Bobbitt, Shalon E, NP   50 mg at 09/22/23 2133   PTA Medications: No medications prior to admission.    Patient Stressors: Marital or family conflict    Patient Strengths: Motivation for treatment/growth  Supportive family/friends   Treatment Modalities: Medication Management, Group therapy, Case management,  1 to 1 session with clinician, Psychoeducation, Recreational therapy.   Physician Treatment Plan for Primary Diagnosis: MDD (major depressive disorder), recurrent severe, without psychosis (HCC) Long Term Goal(s):     Short Term Goals:    Medication Management: Evaluate patient's response, side effects, and tolerance of medication regimen.  Therapeutic Interventions: 1 to 1 sessions, Unit Group sessions and Medication administration.  Evaluation of Outcomes: Not Progressing  Physician Treatment Plan for Secondary Diagnosis: Principal Problem:   MDD (major depressive disorder), recurrent severe, without psychosis (HCC) Active Problems:   GAD (generalized anxiety disorder)   Intentional overdose (HCC)   OCD (  obsessive compulsive disorder)   History of ADHD  Long Term Goal(s):     Short Term Goals:       Medication Management: Evaluate patient's response, side effects, and tolerance of medication regimen.  Therapeutic Interventions: 1 to 1 sessions, Unit Group sessions and Medication administration.  Evaluation of Outcomes: Not Progressing   RN Treatment Plan for Primary Diagnosis: MDD (major depressive disorder), recurrent severe, without psychosis (HCC) Long Term Goal(s): Knowledge of disease and therapeutic regimen to maintain health will improve  Short Term Goals: Ability to remain free from injury will improve, Ability to verbalize  frustration and anger appropriately will improve, Ability to demonstrate self-control, Ability to participate in decision making will improve, Ability to verbalize feelings will improve, Ability to disclose and discuss suicidal ideas, Ability to identify and develop effective coping behaviors will improve, and Compliance with prescribed medications will improve  Medication Management: RN will administer medications as ordered by provider, will assess and evaluate patient's response and provide education to patient for prescribed medication. RN will report any adverse and/or side effects to prescribing provider.  Therapeutic Interventions: 1 on 1 counseling sessions, Psychoeducation, Medication administration, Evaluate responses to treatment, Monitor vital signs and CBGs as ordered, Perform/monitor CIWA, COWS, AIMS and Fall Risk screenings as ordered, Perform wound care treatments as ordered.  Evaluation of Outcomes: Not Progressing   LCSW Treatment Plan for Primary Diagnosis: MDD (major depressive disorder), recurrent severe, without psychosis (HCC) Long Term Goal(s): Safe transition to appropriate next level of care at discharge, Engage patient in therapeutic group addressing interpersonal concerns.  Short Term Goals: Engage patient in aftercare planning with referrals and resources, Increase social support, Increase ability to appropriately verbalize feelings, Increase emotional regulation, Facilitate acceptance of mental health diagnosis and concerns, Facilitate patient progression through stages of change regarding substance use diagnoses and concerns, Identify triggers associated with mental health/substance abuse issues, and Increase skills for wellness and recovery  Therapeutic Interventions: Assess for all discharge needs, 1 to 1 time with Social worker, Explore available resources and support systems, Assess for adequacy in community support network, Educate family and significant other(s) on  suicide prevention, Complete Psychosocial Assessment, Interpersonal group therapy.  Evaluation of Outcomes: Not Progressing   Progress in Treatment: Attending groups: No. Participating in groups: No. Taking medication as prescribed: Yes. Toleration medication: Yes. Family/Significant other contact made: No, will contact:  Blair Heys [phone] (mom) Patient understands diagnosis: Yes. Discussing patient identified problems/goals with staff: Yes. Medical problems stabilized or resolved: Yes. Denies suicidal/homicidal ideation: Yes. Issues/concerns per patient self-inventory: No.   New problem(s) identified: No, Describe:  none  New Short Term/Long Term Goal(s): medication stabilization, elimination of SI thoughts, development of comprehensive mental wellness plan.    Patient Goals:  "Lessen my suicidal thoughts and mood swings, get on medications that work"  Discharge Plan or Barriers: Patient recently admitted. CSW will continue to follow and assess for appropriate referrals and possible discharge planning.    Reason for Continuation of Hospitalization: Anxiety Depression Medication stabilization Suicidal ideation  Estimated Length of Stay: 5-7 days  Last 3 [place] Suicide Severity Risk Score: Flowsheet Row [facility] (Current) [facility] from 09[facility]21/2023 in [facility]  C-SSRS RISK CATEGORY High Risk High Risk       Last PHQ 2/9 Scores:    06/05/2018    2:12 PM  Depression screen PHQ 2/9  Decreased Interest 1  Down, Depressed, Hopeless 2  PHQ - 2 Score 3  Altered sleeping 3  Tired, decreased energy 3  Change in appetite 3  Feeling bad or failure about yourself  0  Trouble concentrating 2  Moving slowly or fidgety/restless 2  Suicidal thoughts 0  PHQ-9 Score 16    Scribe for Treatment Team: Kathi Der, LCSWA 09/23/2023 2:01 PM

## 2023-09-23 NOTE — Group Note (Signed)
Recreation Therapy Group Note   Group Topic:Communication  Group Date: 09/23/2023 Start Time: 0933 End Time: 0955 Facilitators: Tanieka Pownall-McCall, LRT,CTRS Location: [address] Dayroom   Group Topic: Communication, Problem Solving   Goal Area(s) Addresses:  Patient will effectively listen to complete activity.  Patient will identify communication skills used to make activity successful.  Patient will identify how skills used during activity can be used to reach post d/c goals.    Intervention: Building surveyor Activity - Geometric pattern cards, pencils, blank paper    Activity: Geometric Drawings.  Three volunteers from the peer group will be shown an abstract picture with a particular arrangement of geometrical shapes.  Each round, one 'speaker' will describe the pattern, as accurately as possible without revealing the image to the group.  The remaining group members will listen and draw the picture to reflect how it is described to them. Patients with the role of 'listener' cannot ask clarifying questions but, may request that the speaker repeat a direction. Once the drawings are complete, the presenter will show the rest of the group the picture and compare how close each person came to drawing the picture. LRT will facilitate a post-activity discussion regarding effective communication and the importance of planning, listening, and asking for clarification in daily interactions with others.  Education: Environmental consultant, Active listening, Support systems, Discharge planning  Education Outcome: Acknowledges understanding/In group clarification offered/Needs additional education.    Affect/Mood: N/A   Participation Level: Did not attend    Clinical Observations/Individualized Feedback:     Plan: Continue to engage patient in RT group sessions 2-3x/week.   Ashar Lewinski-McCall, LRT,CTRS 09/23/2023 11:46 AM

## 2023-09-23 NOTE — [facility] Group Notes (Signed)
[facility] Group Notes:  (Nursing/MHT/Case Management/Adjunct)  Date:  09/23/2023  Time:  9:43 PM  Type of Therapy:  Psychoeducational Skills  Participation Level:  Did Not Attend  Participation Quality:  Resistant  Affect:  Resistant  Cognitive:  Lacking  Insight:  None  Engagement in Group:  None  Modes of Intervention:  Education  Summary of Progress/Problems: The patient did not attend group this evening.   Westly Pam 09/23/2023, 9:43 PM
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
## 2023-09-23 NOTE — Plan of Care (Signed)
  Problem: Education: Goal: Knowledge of [facility] Education information/materials will improve Outcome: Progressing Goal: Emotional status will improve Outcome: Progressing Goal: Verbalization of understanding the information provided will improve Outcome: Progressing   
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
## 2023-09-23 NOTE — Progress Notes (Signed)
   09/23/23 0930  Psych Admission Type (Psych Patients Only)  Admission Status Involuntary  Psychosocial Assessment  Eye Contact Brief  Facial Expression Flat;Sad  Affect Anxious  Speech Logical/coherent  Interaction Assertive  Motor Activity Other (Comment) (WDL)  Appearance/Hygiene In scrubs  Thought Process  Coherency WDL  Content WDL  Delusions None reported or observed  Perception WDL  Hallucination None reported or observed  Judgment WDL  Confusion WDL  Danger to Self  Current suicidal ideation? Denies  Agreement Not to Harm Self Yes  Description of Agreement Verbal  Danger to Others  Danger to Others None reported or observed

## 2023-09-24 DIAGNOSIS — F332 Major depressive disorder, recurrent severe without psychotic features: Secondary | ICD-10-CM

## 2023-09-24 MED ORDER — SERTRALINE HCL 100 MG PO TABS
100.0000 mg | ORAL_TABLET | Freq: Every day | ORAL | Status: DC
  Administered 2023-09-25 – 2023-09-30 (×6): 100 mg via ORAL
  Filled 2023-09-24 (×7): qty 1

## 2023-09-24 MED ORDER — MIRTAZAPINE 15 MG PO TABS
15.0000 mg | ORAL_TABLET | Freq: Every day | ORAL | Status: DC
  Administered 2023-09-24 – 2023-09-29 (×6): 15 mg via ORAL
  Filled 2023-09-24 (×9): qty 1

## 2023-09-24 MED ORDER — HYDROXYZINE HCL 25 MG PO TABS
25.0000 mg | ORAL_TABLET | Freq: Three times a day (TID) | ORAL | Status: DC | PRN
Start: 2023-09-24 — End: 2023-09-30
  Administered 2023-09-25: 25 mg via ORAL
  Filled 2023-09-24 (×3): qty 1

## 2023-09-24 NOTE — Group Note (Signed)
Date:  09/24/2023 Time:  9:34 AM  Group Topic/Focus:  Goals Group:   The focus of this group is to help patients establish daily goals to achieve during treatment and discuss how the patient can incorporate goal setting into their daily lives to aide in recovery. Orientation:   The focus of this group is to educate the patient on the purpose and policies of crisis stabilization and provide a format to answer questions about their admission.  The group details unit policies and expectations of patients while admitted.    Participation Level:  Active  Participation Quality:  Appropriate  Affect:  Appropriate  Cognitive:  Appropriate  Insight: Appropriate  Engagement in Group:  Developing/Improving  Modes of Intervention:  Discussion  Additional Comments:    Yuki Purves D Kilan Banfill 09/24/2023, 9:34 AM

## 2023-09-24 NOTE — Plan of Care (Signed)
  Problem: Education: Goal: Knowledge of [facility] Education information/materials will improve Outcome: Progressing Goal: Emotional status will improve Outcome: Progressing Goal: Mental status will improve Outcome: Progressing Goal: Verbalization of understanding the information provided will improve Outcome: Progressing   

## 2023-09-24 NOTE — Plan of Care (Signed)
  Problem: Education: Goal: Emotional status will improve Outcome: Progressing   Problem: Education: Goal: Mental status will improve Outcome: Progressing   Problem: Activity: Goal: Interest or engagement in activities will improve Outcome: Progressing Goal: Sleeping patterns will improve Outcome: Progressing   Problem: Coping: Goal: Ability to verbalize frustrations and anger appropriately will improve Outcome: Progressing Goal: Ability to demonstrate self-control will improve Outcome: Progressing   Problem: Safety: Goal: Periods of time without injury will increase Outcome: Progressing

## 2023-09-24 NOTE — Progress Notes (Signed)
   09/24/23 0840  Psych Admission Type (Psych Patients Only)  Admission Status Involuntary  Psychosocial Assessment  Patient Complaints Anxiety;Depression  Eye Contact Brief  Facial Expression Anxious;Flat;Sad  Affect Anxious;Depressed  Speech Logical/coherent  Interaction Assertive  Motor Activity Other (Comment) (WNL)  Appearance/Hygiene In scrubs  Behavior Characteristics Cooperative;Anxious  Mood Anxious;Depressed  Thought Process  Coherency WDL  Content WDL  Delusions None reported or observed  Perception WDL  Hallucination None reported or observed  Judgment WDL  Confusion WDL  Danger to Self  Current suicidal ideation? Denies  Agreement Not to Harm Self Yes  Description of Agreement Verbal
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
## 2023-09-24 NOTE — [facility] Group Notes (Signed)
[facility] Group Notes:  (Nursing/MHT/Case Management/Adjunct)  Date:  09/24/2023  Time:  10:07 PM  Type of Therapy:  Psychoeducational Skills  Participation Level:  Minimal  Participation Quality:  Appropriate  Affect:  Blunted  Cognitive:  Appropriate  Insight:  Improving  Engagement in Group:  Improving  Modes of Intervention:  Education  Summary of Progress/Problems: The patient rated her day as a 5 out of 10 since she felt that her anxiety was the same today. Patient states that she attended all of the groups today. Her goal for tomorrow is to attend all of the groups .   Cassandra Cruz S 09/24/2023, 10:07 PM

## 2023-09-24 NOTE — Group Note (Signed)
Recreation Therapy Group Note   Group Topic:Animal Assisted Therapy   Group Date: 09/24/2023 Start Time: 3875 End Time: 1030 Facilitators: Xochilt Conant-McCall, LRT,CTRS Location: [address] Dayroom   Animal-Assisted Activity (AAA) Program Checklist/Progress Notes Patient Eligibility Criteria Checklist & Daily Group note for Rec Tx Intervention  AAA/T Program Assumption of Risk Form signed by Patient/ or Parent Legal Guardian Yes  Patient is free of allergies or severe asthma Yes  Patient reports no fear of animals Yes  Patient reports no history of cruelty to animals Yes  Patient understands his/her participation is voluntary Yes  Patient washes hands before animal contact Yes  Patient washes hands after animal contact Yes  Education: Hand Washing, Appropriate Animal Interaction   Education Outcome: Acknowledges education.    Affect/Mood: Flat   Participation Level: Active   Participation Quality: Independent   Behavior: Appropriate   Speech/Thought Process: Focused   Insight: Good   Judgement: Good   Modes of Intervention: Teaching laboratory technician   Patient Response to Interventions:  Engaged   Education Outcome:  In group clarification offered    Clinical Observations/Individualized Feedback: Pt was quiet but engaged with the therapy dog team. Pt also had minimal interaction with peers.     Plan: Continue to engage patient in RT group sessions 2-3x/week.   Babe Anthis-McCall, LRT,CTRS 09/24/2023 1:17 PM

## 2023-09-24 NOTE — Group Note (Signed)
LCSW Group Therapy Note   Group Date: 09/24/2023 Start Time: 1100 End Time: 1200   Type of Therapy:  Group Therapy  Topic:  Shining from Within:  Confidence and Self-Love Journey  Participation:  did not attend  Objective: Promote Self-Awareness and Realistic Self-Talk: Help participants recognize their strengths and replace negative thoughts with truthful, realistic statements to build confidence.  Goals: Increase Confidence: Help participants develop a positive self-image by focusing on their strengths and personal progress. Set Achievable Goals: Guide participants in creating small, realistic goals that foster a sense of accomplishment and build momentum. Enhance Self-Care Practices: Encourage participants to incorporate self-care activities into their routine to support emotional well-being and reinforce confidence.  Summary: The "Shining from Within: Confidence and Self-Love Journey" group helped individuals build stronger self-confidence through truthful, realistic self-talk, goal-setting, and self-care practices. Participants recognized their unique strengths, set achievable goals, and nurtured a supportive environment for mutual growth. The group provided practical tools to foster lasting confidence and self-belief, empowering each member to shine from within and embrace their personal power with greater self-assurance.  Therapeutic Modalities: Elements of CBT Elements of DBT   Alla Feeling, LCSWA 09/24/2023  6:30 PM

## 2023-09-25 NOTE — Group Note (Signed)
Date:  09/25/2023 Time:  10:36 AM  Group Topic/Focus:  Goals Group:   The focus of this group is to help patients establish daily goals to achieve during treatment and discuss how the patient can incorporate goal setting into their daily lives to aide in recovery. Orientation:   The focus of this group is to educate the patient on the purpose and policies of crisis stabilization and provide a format to answer questions about their admission.  The group details unit policies and expectations of patients while admitted.    Participation Level:  Did Not Attend  Participation Quality:   n/a  Affect:   n/a  Cognitive:   n/a  Insight: None  Engagement in Group:   n/a  Modes of Intervention:   n/a  Additional Comments:   Pt did not attend.   Edmund Hilda Annalee Meyerhoff 09/25/2023, 10:36 AM
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
## 2023-09-25 NOTE — Plan of Care (Signed)
  Problem: Coping: Goal: Ability to verbalize frustrations and anger appropriately will improve Outcome: Progressing Goal: Ability to demonstrate self-control will improve Outcome: Progressing   

## 2023-09-25 NOTE — Progress Notes (Signed)
   09/25/23 2300  Psych Admission Type (Psych Patients Only)  Admission Status Involuntary  Psychosocial Assessment  Patient Complaints Anxiety;Depression  Eye Contact Fair  Facial Expression Anxious;Worried;Wide-eyed  Affect Anxious;Depressed  Personal assistant Activity Other (Comment) (WDL)  Appearance/Hygiene In scrubs  Behavior Characteristics Cooperative  Mood Depressed;Anxious  Thought Process  Coherency WDL  Content WDL  Delusions None reported or observed  Perception WDL  Hallucination None reported or observed  Judgment Impaired  Confusion None  Danger to Self  Current suicidal ideation? Denies  Self-Injurious Behavior No self-injurious ideation or behavior indicators observed or expressed   Agreement Not to Harm Self Yes  Description of Agreement verbal  Danger to Others  Danger to Others None reported or observed

## 2023-09-25 NOTE — Plan of Care (Signed)
  Problem: Education: Goal: Verbalization of understanding the information provided will improve Outcome: Progressing   

## 2023-09-25 NOTE — Progress Notes (Signed)
   09/25/23 0900  Psych Admission Type (Psych Patients Only)  Admission Status Involuntary  Psychosocial Assessment  Patient Complaints Anxiety;Depression  Eye Contact Fair  Facial Expression Anxious;Worried;Wide-eyed  Affect Anxious;Depressed  Solicitor Assertive;Isolative  Appearance/Hygiene In scrubs  Behavior Characteristics Cooperative  Mood Depressed;Anxious  Thought Process  Coherency WDL  Content WDL  Delusions None reported or observed  Perception WDL  Hallucination None reported or observed  Judgment Impaired  Confusion None  Danger to Self  Current suicidal ideation? Denies  Self-Injurious Behavior No self-injurious ideation or behavior indicators observed or expressed   Agreement Not to Harm Self Yes  Description of Agreement Verbal  Danger to Others  Danger to Others None reported or observed

## 2023-09-25 NOTE — Progress Notes (Signed)
   09/24/23 2115  Psych Admission Type (Psych Patients Only)  Admission Status Involuntary  Psychosocial Assessment  Patient Complaints Anxiety;Depression ("Anxiety was 8/10 but now 4/10.")  Eye Contact Fair  Facial Expression Anxious;Wide-eyed;Sad  Affect Blunted  Speech Logical/coherent  Interaction Assertive;Cautious;Isolative  Appearance/Hygiene In scrubs  Behavior Characteristics Cooperative  Mood Depressed;Anxious ("I'm ok with a roommate but I just want to stay in my room.  I always have negative thoughts.")  Thought Process  Coherency WDL  Content WDL  Delusions None reported or observed  Perception WDL  Hallucination None reported or observed  Judgment Impaired  Confusion None  Danger to Self  Current suicidal ideation? Denies  Self-Injurious Behavior No self-injurious ideation or behavior indicators observed or expressed   Agreement Not to Harm Self Yes  Description of Agreement Verbal  Danger to Others  Danger to Others None reported or observed

## 2023-09-25 NOTE — Group Note (Signed)
Date:  09/25/2023 Time:  4:45 PM  Group Topic/Focus:  Dimensions of Wellness:   The focus of this group is to introduce the topic of wellness and discuss the role each dimension of wellness plays in total health.    Participation Level:  Did Not Attend  Participation Quality:   n/a  Affect:   n/a  Cognitive:   n/a  Insight: None  Engagement in Group:   n/a  Modes of Intervention:   n/a  Additional Comments:   Pt did not attend.  Edmund Hilda Maguadalupe Lata 09/25/2023, 4:45 PM

## 2023-09-25 NOTE — Group Note (Signed)
Recreation Therapy Group Note   Group Topic:Other  Group Date: 09/25/2023 Start Time: 1405 End Time: 1445 Facilitators: Isel Skufca-McCall, LRT, CTRS Location: [address] Dayroom   Activity Description/Intervention: Therapeutic Drumming. Patients with peers and staff were given the opportunity to engage in a leader facilitated [facility] Empowerment Drumming Circle with staff from the [employer], in partnership with [employer]. Teaching laboratory technician and trained [employer], Theodoro Doing leading with LRT observing and documenting intervention and pt response. This evidenced-based practice targets 7 areas of health and wellbeing in the human experience including: stress-reduction, exercise, self-expression, camaraderie/support, nurturing, spirituality, and music-making (leisure).   Goal Area(s) Addresses:  Patient will engage in pro-social way in music group.  Patient will follow directions of drum leader on the first prompt. Patient will demonstrate no behavioral issues during group.  Patient will identify if a reduction in stress level occurs as a result of participation in therapeutic drum circle.    Education: Leisure exposure, Pharmacologist, Musical expression, Discharge Planning   Affect/Mood: N/A   Participation Level: Did not attend    Clinical Observations/Individualized Feedback:     Plan: Continue to engage patient in RT group sessions 2-3x/week.   Soul Deveney-McCall, LRT,CTRS 09/25/2023 3:37 PM

## 2023-09-26 DIAGNOSIS — F332 Major depressive disorder, recurrent severe without psychotic features: Secondary | ICD-10-CM | POA: Diagnosis not present

## 2023-09-26 MED ORDER — BUSPIRONE HCL 10 MG PO TABS
10.0000 mg | ORAL_TABLET | Freq: Three times a day (TID) | ORAL | Status: DC
  Administered 2023-09-26 – 2023-09-27 (×4): 10 mg via ORAL
  Filled 2023-09-26 (×9): qty 1
  Filled 2023-09-26: qty 2

## 2023-09-26 NOTE — Group Note (Signed)
Date:  09/26/2023 Time:  11:16 AM  Group Topic/Focus:  Goals Group:   The focus of this group is to help patients establish daily goals to achieve during treatment and discuss how the patient can incorporate goal setting into their daily lives to aide in recovery. Orientation:   The focus of this group is to educate the patient on the purpose and policies of crisis stabilization and provide a format to answer questions about their admission.  The group details unit policies and expectations of patients while admitted.    Participation Level:  Active  Participation Quality:  Attentive  Affect:  Appropriate  Cognitive:  Appropriate  Insight: Appropriate  Engagement in Group:  Engaged  Modes of Intervention:  Discussion  Additional Comments:  Patient attend goals group and was attentive the duration of it. Patient's goal was to attend all groups.   Domonique Brouillard T Remona Boom 09/26/2023, 11:16 AM

## 2023-09-26 NOTE — [facility] Suicide Risk Assessment (Signed)
[facility] INPATIENT:  Family/Significant Other Suicide Prevention Education  Suicide Prevention Education:  Education Completed; Blair Heys, [phone] (mom),  (name of family member/significant other) has been identified by the patient as the family member/significant other with whom the patient will be residing, and identified as the person(s) who will aid the patient in the event of a mental health crisis (suicidal ideations/suicide attempt).  With written consent from the patient, the family member/significant other has been provided the following suicide prevention education, prior to the and/or following the discharge of the patient.  Mom reports there are firearms in the home but they are locked in a safe that the patient does not have access to. Mom reports that she has gone through patient's room and removed potentially dangerous objects. Mom was encouraged to store away cleaning supplies and sharp objects, mom was agreeable.   Mom will pick patient up at discharge and would like a call from social work day of to confirm date and time she needs to be here.  The suicide prevention education provided includes the following: Suicide risk factors Suicide prevention and interventions National Suicide Hotline telephone number [facility] assessment telephone number [facility] Emergency Assistance 911 [facility] and/or Residential Mobile Crisis Unit telephone number  Request made of family/significant other to: Remove weapons (e.g., guns, rifles, knives), all items previously/currently identified as safety concern.   Remove drugs/medications (over-the-counter, prescriptions, illicit drugs), all items previously/currently identified as a safety concern.  The family member/significant other verbalizes understanding of the suicide prevention education information provided.  The family member/significant other agrees to remove the items of safety concern listed above.  Kathi Der 09/26/2023, 11:25 AM

## 2023-09-26 NOTE — Plan of Care (Signed)
  Problem: Education: Goal: Knowledge of [facility] Education information/materials will improve Outcome: Progressing Goal: Emotional status will improve Outcome: Progressing Goal: Mental status will improve Outcome: Progressing Goal: Verbalization of understanding the information provided will improve Outcome: Progressing   
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
## 2023-09-26 NOTE — Discharge Instructions (Signed)
-  Follow-up with your outpatient psychiatric provider (and therapist) -instructions on appointment date, time, and address (location) are provided to you in discharge paperwork.  -Take your psychiatric medications as prescribed at discharge - instructions are provided to you in the discharge paperwork  -Follow-up with outpatient primary care doctor and other specialists -for management of preventative medicine and any chronic medical disease.  -Recommend abstinence from alcohol, tobacco, cannabis, and other substances at discharge.   -If your psychiatric symptoms recur, worsen, or if you have severe side effects to your psychiatric medications, call your outpatient psychiatric provider, 911, 988 (national suicide hotline), go to [facility], or go to the nearest emergency department.  -If suicidal thoughts occur, call your outpatient psychiatric provider, 911, 988 (national suicide hotline), go to [facility], or go to the nearest emergency department.  Naloxone (Narcan) can help reverse an overdose when given to the victim quickly.  [facility] offers free naloxone kits and instructions/training on its use.  Add naloxone to your first aid kit and you can help save a life.   Pick up your free kit at the following locations:   [place]:  [facility], [address] [place] [address] [phone]) [facility] [address] [place] [address] [phone]) [facility] [address] [place] [address]  [place]: [facility] [address] [place] [address] ([phone]) [facility] [address] [place] [address] [phone])    ---------------------   If you would like to refer someone for a mental health assessment,  outpatient therapy or psychiatric medication management, or you are interested in these services for yourself, a referral form is NOT required.    Have ID and insurance card available Must be physically located in [place] when receiving virtual services If the patient has a legal guardian (parent or court-appointed), the guardian must be present to sign consents to treat

## 2023-09-26 NOTE — Group Note (Signed)
[facility] LCSW Group Therapy Note   Group Date: 09/26/2023 Start Time: 1100 End Time: 1200   Type of Therapy/Topic:  Group Therapy:  Emotion Regulation  Participation Level:  Did Not Attend   Mood:  Description of Group:    The purpose of this group is to assist patients in learning to regulate negative emotions and experience positive emotions. Patients will be guided to discuss ways in which they have been vulnerable to their negative emotions. These vulnerabilities will be juxtaposed with experiences of positive emotions or situations, and patients challenged to use positive emotions to combat negative ones. Special emphasis will be placed on coping with negative emotions in conflict situations, and patients will process healthy conflict resolution skills.  Therapeutic Goals: Patient will identify two positive emotions or experiences to reflect on in order to balance out negative emotions:  Patient will label two or more emotions that they find the most difficult to experience:  Patient will be able to demonstrate positive conflict resolution skills through discussion or role plays:   Summary of Patient Progress:   Pt was invited, did not attend    Therapeutic Modalities:   Cognitive Behavioral Therapy Feelings Identification Dialectical Behavioral Therapy   Steffanie Dunn, LCSW

## 2023-09-26 NOTE — Plan of Care (Signed)
?  Problem: Education: ?Goal: Verbalization of understanding the information provided will improve ?Outcome: Progressing ?  ?Problem: Activity: ?Goal: Interest or engagement in activities will improve ?Outcome: Progressing ?  ?Problem: Health Behavior/Discharge Planning: ?Goal: Compliance with treatment plan for underlying cause of condition will improve ?Outcome: Progressing ?  ?Problem: Safety: ?Goal: Periods of time without injury will increase ?Outcome: Progressing ?  ?

## 2023-09-26 NOTE — [facility] Suicide Risk Assessment (Signed)
Suicide Risk Assessment  Discharge Assessment    [facility] Discharge Suicide Risk Assessment   Principal Problem: MDD (major depressive disorder), recurrent severe, without psychosis (HCC) Discharge Diagnoses: Principal Problem:   MDD (major depressive disorder), recurrent severe, without psychosis (HCC) Active Problems:   GAD (generalized anxiety disorder)   Intentional overdose (HCC)   OCD (obsessive compulsive disorder)   History of ADHD  Cassandra Cruz is a 23 y.o. female  with a past psychiatric history of MDD, GAD, PTSD; no past psychiatric hospitalizations; suicide attempt in Dec 2024 and hx of self harm. Patient initially arrived to [facility] on 09/21/23 for suicide attempt by isopropyl alcohol ingestion, and admitted to [facility] under IVC on 09/22/23 for crisis stabalization, acute suicidal or self-harming behaviors, and intensive therapeutic interventions.   Hospital course: Patient carries past diagnoses of MDD and GAD and on admission was continued meet criteria for these but also was experiencing 2 years of intrusive thoughts and compulsions and met criteria for OCD with fair/good insight.  Patient was initiated on Zoloft and mirtazapine and titrated to higher doses.  Zoloft was chosen due to ability to titrate to high doses for OCD.  Mirtazapine was chosen because patient is having issues with sleep and also to help as an appetite stimulant.  Patient was trialed on propranolol but could not tolerate due to hypotension.  Patient tried hydroxyzine but reported it was not helpful.  Patient was started on BuSpar for benefit.  At discharge plan to get patient therapist.  Throughout the admission she was anxious and generally stay to her room but often came out for groups despite this anxiety.  Her appetite during hospitalization was poor but she reports at baseline she only eats 1 meal per day and has BMI of 16, so she was started on Ensure.  Patient was referred for [facility] and she will start on Tuesday 1/28  through the [facility].   During the patient's hospitalization, patient had extensive initial psychiatric evaluation, and follow-up psychiatric evaluations every day.   Psychiatric diagnoses provided upon initial assessment:  Intentional overdose Obsessive-compulsive disorder (with fair/good insight) Major depressive disorder, severe, recurrent, without psychotic features Generalized anxiety disorder History of ADHD   Patient's psychiatric medications were adjusted on admission:  -- Start Zoloft 25 mg once, then 50 mg once daily for OCD, MDD, GAD -- Start mirtazapine 7.5 mg once at bedtime for MDD -- Start propranolol 5 mg once in the morning and once at bedtime for anxiety             -- Continue trazodone 50 mg once nightly as needed for insomnia             -- Continue hydroxyzine 25 mg 3 times daily as needed for anxiety   During the hospitalization, other adjustments were made to the patient's psychiatric medication regimen:  -- Discontinue propranolol due to soft BPs -- Increased sertraline to 100 mg once daily for OCD, MDD, GAD -- Increased mirtazapine to 15 mg once at bedtime for MDD, insomnia, poor appetite -- Started buspirone 15 mg 3 times daily for GAD, MDD             -- Continue trazodone 50 mg once nightly as needed for insomnia             -- Continue hydroxyzine 25 mg 3 times daily as needed for anxiety   Patient's care was discussed during the interdisciplinary team meeting every day during the hospitalization.  The patient is not having side effects to prescribed psychiatric medication except for some mild dry mouth which she reports is tolerable.   Gradually, patient started adjusting to milieu. The patient was evaluated each day by a clinical provider to ascertain response to treatment. Improvement was noted by the patient's report of decreasing symptoms, improved sleep and appetite, affect, medication tolerance, behavior, and  participation in unit programming.  Patient was asked each day to complete a self inventory noting mood, mental status, pain, new symptoms, anxiety and concerns.   Symptoms were reported as significantly decreased or resolved completely by discharge.  The patient reports that their mood is stable.  The patient denied having suicidal thoughts for more than 48 hours prior to discharge.  Patient denies having homicidal thoughts.  Patient denies having auditory hallucinations.  Patient denies any visual hallucinations or other symptoms of psychosis.  The patient was motivated to continue taking medication with a goal of continued improvement in mental health.    The patient reports their target psychiatric symptoms of intrusive thoughts, suicidal thoughts, depressive symptoms, anxiety responded well to the psychiatric medications, and the patient reports overall benefit other psychiatric hospitalization. Supportive psychotherapy was provided to the patient. The patient also participated in regular group therapy while hospitalized. Coping skills, problem solving as well as relaxation therapies were also part of the unit programming.   Labs were reviewed with the patient, and abnormal results were discussed with the patient.   The patient is able to verbalize their individual safety plan to this provider.   # It is recommended to the patient to continue psychiatric medications as prescribed, after discharge from the hospital.     # It is recommended to the patient to follow up with your outpatient psychiatric provider and PCP.   # It was discussed with the patient, the impact of alcohol, drugs, tobacco have been there overall psychiatric and medical wellbeing, and total abstinence from substance use was recommended the patient.ed.   # Prescriptions provided or sent directly to preferred pharmacy at discharge. Patient agreeable to plan. Given opportunity to ask questions. Appears to feel comfortable with  discharge.    # In the event of worsening symptoms, the patient is instructed to call the crisis hotline, 911 and or go to the nearest ED for appropriate evaluation and treatment of symptoms. To follow-up with primary care provider for other medical issues, concerns and or health care needs   # Patient was discharged home with a plan to follow up as noted below.       On day of discharge reports that she is doing okay today.  Reports that she slept well.  Reports that her appetite is been good and she has been using ensures and found them helpful for supplement her diet.  She reports that they use [facility] on [address] for medications.  Discussed that we will try and get her into PHP this morning and will provide her a work excuse.  She denies SI, HI, AVH, paranoia, intrusive thoughts.  Denies having any side effects on current medications and reports that her anxiety is much improved on buspirone specifically.  Total Time spent with patient: 45 minutes  Musculoskeletal: Strength & Muscle Tone: within normal limits Gait & Station: normal Patient leans: N/A  Psychiatric Specialty Exam  Presentation  General Appearance:  Appropriate for Environment  Eye Contact: Fair  Speech: Normal Rate  Speech Volume: Normal  Handedness:No data recorded  Mood and Affect  Mood: Euthymic  Duration of Depression Symptoms: No data recorded Affect: Appropriate; Congruent; Full Range   Thought Process  Thought Processes: Coherent; Linear  Descriptions of Associations:Intact  Orientation:Full (Time, Place and Person)  Thought Content:Logical  History of Schizophrenia/Schizoaffective disorder:No  Duration of Psychotic Symptoms:N/A  Hallucinations:Hallucinations: None  Ideas of Reference:None  Suicidal Thoughts:Suicidal Thoughts: No  Homicidal Thoughts:Homicidal Thoughts: No   Sensorium  Memory: Immediate Good; Recent Good; Remote  Good  Judgment: Good  Insight: Good   Executive Functions  Concentration: Good  Attention Span: Good  Recall: Good  Fund of Knowledge: Good  Language: Good   Psychomotor Activity  Psychomotor Activity: Psychomotor Activity: Normal   Assets  Assets: Desire for Improvement; Housing; Social Support; Transportation; Vocational/Educational; Leisure Time   Sleep  Sleep: Sleep: Good Number of Hours of Sleep: 8.75   Physical Exam: Physical Exam Vitals and nursing note reviewed.  Pulmonary:     Effort: Pulmonary effort is normal.  Neurological:     General: No focal deficit present.     Mental Status: She is alert.    Review of Systems  Constitutional:  Negative for fever.  HENT:         Positive for dry mouth.  Cardiovascular:  Negative for chest pain and palpitations.  Gastrointestinal:  Negative for constipation, diarrhea, nausea and vomiting.  Neurological:  Negative for dizziness, weakness and headaches.   Blood pressure 97/72, pulse (!) 110, temperature 98.4 F (36.9 C), temperature source Oral, resp. rate 16, height 5\' 1"  (1.549 m), weight 39.9 kg, last menstrual period 09/24/2023, SpO2 98%. Body mass index is 16.63 kg/m.  Mental Status Per Nursing Assessment::   On Admission:  Self-harm thoughts  Demographic Factors:  Adolescent or young adult and Caucasian  Loss Factors: NA  Historical Factors: Prior suicide attempts and Family history of mental illness or substance abuse  Risk Reduction Factors:   Sense of responsibility to family, Employed, Living with another person, especially a relative, and Positive social support  Continued Clinical Symptoms:  Mood is stable. Anxiety at a manageable level. Denying any SI including passive SI.   Cognitive Features That Contribute To Risk:  None    Suicide Risk:  Mild:  There are no identifiable suicide plans, no associated intent, mild dysphoria and related symptoms, good self-control (both  objective and subjective assessment), few other risk factors, and identifiable protective factors, including available and accessible social support.    Follow-up Information     [facility]. Go to.   Specialty: Behavioral Health Why: Please go to this provider for medication management services. You may call to schedule an appointment or go Monday through Friday, arrive at 7:00 am. Contact information: [address] [place] [address] [phone]        [facility]. Go to.   Specialty: Professional Counselor Why: Please go to this provider for therapy services on Monday through Friday, 9 am to 1 pm for an initial assessment. Contact information: [address] [place] [address] [phone]         [facility] Follow up.   Why: Please have the patient or guardian call us at [phone] between 8am - 3pm Monday - Friday to complete initial registration and assessment virtuall through our open access process. The patient can also walk-in to our nearest behavioral health outpatient office if preferred. If walking in, note that our offices are closed for lunch from 12-1pm. Please share the following with the patient when instructing  them to call/walk-in to [facility] information: [address]  [address] [place] [address] [phone]         [facility] Follow up on 10/01/2023.   Specialty: Behavioral Health Why: You are scheduled for an assessment for the PHP on Tuesday, 1/28 at 10:00 am. This appointment will last approximately one hour and will be virtual via [employer] Please download the Teams app prior to the appointment. You will receive a link via email to join the meeting and will click on the link to connect. If your appointment is scheduled as a MyChart video visit, please do not join that way, as we need to ensure you are able to use Teams for  group.  If you need to cancel or reschedule, please call [phone] and leave a voicemail with your name, date of birth, and phone number Contact information: [address] [place] [address] [phone]                Plan Of Care/Follow-up recommendations:  Activity: as tolerated   Diet: heart healthy   Other: -Follow-up with your outpatient psychiatric provider -instructions on appointment date, time, and address (location) are provided to you in discharge paperwork.   -Take your psychiatric medications as prescribed at discharge - instructions are provided to you in the discharge paperwork   -Follow-up with outpatient primary care doctor and other specialists -for management of chronic medical disease, including: none   -Testing: Follow-up with outpatient provider for abnormal lab results: vitamin D deficiency, low BMI   -Recommend abstinence from alcohol, tobacco, and other illicit drug use at discharge.    -If your psychiatric symptoms recur, worsen, or if you have side effects to your psychiatric medications, call your outpatient psychiatric provider, 911, [phone] or go to the nearest emergency department.   -If suicidal thoughts recur, call your outpatient psychiatric provider, 911, [phone] or go to the nearest emergency department.    Meryl Dare, MD PGY-1 Psychiatry Resident 09/30/2023, 9:37 AM

## 2023-09-26 NOTE — Group Note (Signed)
Date:  09/26/2023 Time:  9:50 PM  Group Topic/Focus:  Wrap-Up Group:   The focus of this group is to help patients review their daily goal of treatment and discuss progress on daily workbooks.    Participation Level:  Did Not Attend  Participation Quality:  Resistant  Affect:  Resistant  Cognitive:  Lacking  Insight: None  Engagement in Group:  Resistant  Modes of Intervention:  Discussion  Additional Comments:  Patient did not attend wrap up group  Cassandra Cruz 09/26/2023, 9:50 PM

## 2023-09-26 NOTE — Progress Notes (Signed)
Patient presents with flat affect and very minimal. Patient denies SI,HI, and A/V/H with no plan or intent. Patient is med compliant and received educational handout for Buspar. Patient remains cooperative in unit and stated her goal was to attend groups today. No s/s of current distress.
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
## 2023-09-26 NOTE — [facility] Group Notes (Signed)

## 2023-09-27 ENCOUNTER — Encounter (HOSPITAL_COMMUNITY): Payer: Self-pay

## 2023-09-27 DIAGNOSIS — F332 Major depressive disorder, recurrent severe without psychotic features: Secondary | ICD-10-CM | POA: Diagnosis not present

## 2023-09-27 MED ORDER — BUSPIRONE HCL 15 MG PO TABS
15.0000 mg | ORAL_TABLET | Freq: Three times a day (TID) | ORAL | Status: DC
Start: 2023-09-27 — End: 2023-09-30
  Administered 2023-09-27 – 2023-09-30 (×8): 15 mg via ORAL
  Filled 2023-09-27 (×11): qty 1

## 2023-09-27 NOTE — Group Note (Signed)
Date:  09/27/2023 Time:  1:02 PM  Group Topic/Focus:  Goals Group:   The focus of this group is to help patients establish daily goals to achieve during treatment and discuss how the patient can incorporate goal setting into their daily lives to aide in recovery. Orientation:   The focus of this group is to educate the patient on the purpose and policies of crisis stabilization and provide a format to answer questions about their admission.  The group details unit policies and expectations of patients while admitted.    Participation Level:  Minimal  Participation Quality:  Appropriate  Affect:  Appropriate  Cognitive:  Appropriate  Insight: Appropriate  Engagement in Group:  Engaged  Modes of Intervention:  Discussion, Orientation, and Rapport Building  Additional Comments:   Pt attended and participated in the Orientation and Goals group. Pt was quiet yet attentive during Orientation. Pt personal goal for today is to attend scheduled groups.   Edmund Hilda Rene Gonsoulin 09/27/2023, 1:02 PM

## 2023-09-27 NOTE — Group Note (Signed)
Date:  09/27/2023 Time:  4:55 PM  Group Topic/Focus:  Social Wellness (Self and Interpersonal)    Participation Level:  Did Not Attend  Participation Quality:   n/a  Affect:   n/a  Cognitive:   n/a  Insight: None  Engagement in Group:   n/a  Modes of Intervention:   n/a  Additional Comments:   Pt did not attend.  Edmund Hilda Kayleana Waites 09/27/2023, 4:55 PM

## 2023-09-27 NOTE — Plan of Care (Signed)
Problem: Education: Goal: Knowledge of [facility] Education information/materials will improve Outcome: Progressing Goal: Emotional status will improve Outcome: Progressing Goal: Mental status will improve Outcome: Progressing Goal: Verbalization of understanding the information provided will improve Outcome: Progressing   Problem: Activity: Goal: Interest or engagement in activities will improve Outcome: Progressing Goal: Sleeping patterns will improve Outcome: Progressing

## 2023-09-27 NOTE — BH IP Treatment Plan (Signed)
Interdisciplinary Treatment and Diagnostic Plan Update  09/27/2023 Time of Session: 11:20 AM - UPDATE Cassandra Cruz MRN: [id number]  Principal Diagnosis: MDD (major depressive disorder), recurrent severe, without psychosis (HCC)  Secondary Diagnoses: Principal Problem:   MDD (major depressive disorder), recurrent severe, without psychosis (HCC) Active Problems:   GAD (generalized anxiety disorder)   Intentional overdose (HCC)   OCD (obsessive compulsive disorder)   History of ADHD   Current Medications:  Current Facility-Administered Medications  Medication Dose Route Frequency Provider Last Rate Last Admin   acetaminophen (TYLENOL) tablet 650 mg  650 mg Oral Q6H PRN Bobbitt, Shalon E, NP       alum & mag hydroxide-simeth (MAALOX/MYLANTA) 200-200-20 MG/5ML suspension 30 mL  30 mL Oral Q4H PRN Bobbitt, Shalon E, NP       busPIRone (BUSPAR) tablet 15 mg  15 mg Oral TID Meryl Dare, MD       haloperidol (HALDOL) tablet 5 mg  5 mg Oral TID PRN Bobbitt, Shalon E, NP       And   diphenhydrAMINE (BENADRYL) capsule 50 mg  50 mg Oral TID PRN Bobbitt, Shalon E, NP       haloperidol lactate (HALDOL) injection 5 mg  5 mg Intramuscular TID PRN Bobbitt, Shalon E, NP       And   diphenhydrAMINE (BENADRYL) injection 50 mg  50 mg Intramuscular TID PRN Bobbitt, Shalon E, NP       And   LORazepam (ATIVAN) injection 2 mg  2 mg Intramuscular TID PRN Bobbitt, Shalon E, NP       haloperidol lactate (HALDOL) injection 10 mg  10 mg Intramuscular TID PRN Bobbitt, Shalon E, NP       And   diphenhydrAMINE (BENADRYL) injection 50 mg  50 mg Intramuscular TID PRN Bobbitt, Shalon E, NP       And   LORazepam (ATIVAN) injection 2 mg  2 mg Intramuscular TID PRN Bobbitt, Shalon E, NP       hydrOXYzine (ATARAX) tablet 25 mg  25 mg Oral TID PRN Meryl Dare, MD   25 mg at 09/25/23 0935   magnesium hydroxide (MILK OF MAGNESIA) suspension 30 mL  30 mL Oral Daily PRN Bobbitt, Shalon E, NP       mirtazapine  (REMERON) tablet 15 mg  15 mg Oral QHS Meryl Dare, MD   15 mg at 09/26/23 2048   sertraline (ZOLOFT) tablet 100 mg  100 mg Oral Daily Meryl Dare, MD   100 mg at 09/27/23 0802   traZODone (DESYREL) tablet 50 mg  50 mg Oral QHS PRN Bobbitt, Shalon E, NP   50 mg at 09/22/23 2133   Vitamin D (Ergocalciferol) (DRISDOL) 1.25 MG (50000 UNIT) capsule 50,000 Units  50,000 Units Oral Q7 days Meryl Dare, MD   50,000 Units at 09/23/23 1634   vitamin D3 (CHOLECALCIFEROL) tablet 2,000 Units  2,000 Units Oral Daily Meryl Dare, MD   2,000 Units at 09/27/23 0802   PTA Medications: No medications prior to admission.    Patient Stressors: Marital or family conflict    Patient Strengths: Motivation for treatment/growth  Supportive family/friends   Treatment Modalities: Medication Management, Group therapy, Case management,  1 to 1 session with clinician, Psychoeducation, Recreational therapy.   Physician Treatment Plan for Primary Diagnosis: MDD (major depressive disorder), recurrent severe, without psychosis (HCC) Long Term Goal(s):     Short Term Goals:    Medication Management: Evaluate patient's response, side effects, and tolerance of medication regimen.  Therapeutic Interventions: 1 to 1 sessions, Unit Group sessions and Medication administration.  Evaluation of Outcomes: Progressing  Physician Treatment Plan for Secondary Diagnosis: Principal Problem:   MDD (major depressive disorder), recurrent severe, without psychosis (HCC) Active Problems:   GAD (generalized anxiety disorder)   Intentional overdose (HCC)   OCD (obsessive compulsive disorder)   History of ADHD  Long Term Goal(s):     Short Term Goals:       Medication Management: Evaluate patient's response, side effects, and tolerance of medication regimen.  Therapeutic Interventions: 1 to 1 sessions, Unit Group sessions and Medication administration.  Evaluation of Outcomes: Progressing   RN Treatment Plan for  Primary Diagnosis: MDD (major depressive disorder), recurrent severe, without psychosis (HCC) Long Term Goal(s): Knowledge of disease and therapeutic regimen to maintain health will improve  Short Term Goals: Ability to remain free from injury will improve, Ability to demonstrate self-control, Ability to verbalize feelings will improve, and Ability to identify and develop effective coping behaviors will improve  Medication Management: RN will administer medications as ordered by provider, will assess and evaluate patient's response and provide education to patient for prescribed medication. RN will report any adverse and/or side effects to prescribing provider.  Therapeutic Interventions: 1 on 1 counseling sessions, Psychoeducation, Medication administration, Evaluate responses to treatment, Monitor vital signs and CBGs as ordered, Perform/monitor CIWA, COWS, AIMS and Fall Risk screenings as ordered, Perform wound care treatments as ordered.  Evaluation of Outcomes: Progressing   LCSW Treatment Plan for Primary Diagnosis: MDD (major depressive disorder), recurrent severe, without psychosis (HCC) Long Term Goal(s): Safe transition to appropriate next level of care at discharge, Engage patient in therapeutic group addressing interpersonal concerns.  Short Term Goals: Engage patient in aftercare planning with referrals and resources, Increase social support, Increase emotional regulation, Facilitate acceptance of mental health diagnosis and concerns, Identify triggers associated with mental health/substance abuse issues, and Increase skills for wellness and recovery  Therapeutic Interventions: Assess for all discharge needs, 1 to 1 time with Social worker, Explore available resources and support systems, Assess for adequacy in community support network, Educate family and significant other(s) on suicide prevention, Complete Psychosocial Assessment, Interpersonal group therapy.  Evaluation of  Outcomes: Progressing   Progress in Treatment: Attending groups: No. Participating in groups: No. Taking medication as prescribed: Yes. Toleration medication: Yes. Family/Significant other contact made: Yes, contacted:  Blair Heys [phone] (mom) Patient understands diagnosis: Yes. Discussing patient identified problems/goals with staff: Yes. Medical problems stabilized or resolved: Yes. Denies suicidal/homicidal ideation: Yes. Issues/concerns per patient self-inventory: No.     New problem(s) identified: No, Describe:  none   New Short Term/Long Term Goal(s): medication stabilization, elimination of SI thoughts, development of comprehensive mental wellness plan.      Patient Goals:  "Lessen my suicidal thoughts and mood swings, get on medications that work"   Discharge Plan or Barriers: Patient recently admitted. CSW will continue to follow and assess for appropriate referrals and possible discharge planning.      Reason for Continuation of Hospitalization: Anxiety Depression Medication stabilization Suicidal ideation   Estimated Length of Stay: 2 - 4 days  Last 3 [place] Suicide Severity Risk Score: Flowsheet Row [facility] (Current[facility] from [facility]  C-SSRS RISK CATEGORY High Risk High Risk       Last [facility] 2/9 Scores:    06/05/2018    2:12 PM  Depression screen PHQ  2/9  Decreased Interest 1  Down, Depressed, Hopeless 2  PHQ - 2 Score 3  Altered sleeping 3  Tired, decreased energy 3  Change in appetite 3  Feeling bad or failure about yourself  0  Trouble concentrating 2  Moving slowly or fidgety/restless 2  Suicidal thoughts 0  PHQ-9 Score 16    Scribe for Treatment Team: Nyra Jabs 09/27/2023 4:46 PM

## 2023-09-27 NOTE — Group Note (Signed)
Recreation Therapy Group Note   Group Topic:Team Building  Group Date: 09/27/2023 Start Time: 0930 End Time: 1000 Facilitators: Carl Bleecker-McCall, LRT,CTRS Location: [address] Dayroom   Group Topic: Communication, Team Building, Problem Solving  Goal Area(s) Addresses:  Patient will effectively work with peer towards shared goal.  Patient will identify skills used to make activity successful.  Patient will share challenges and verbalize solution-driven approaches used. Patient will identify how skills used during activity can be used to reach post d/c goals.   Intervention: STEM Activity   Activity: [employer]. Patients were provided the following materials: 4 drinking straws, 5 rubber bands, 5 paper clips, 2 index cards, and 2 drinking cups. Using the provided materials patients were asked to build a launching mechanism to launch a ping pong ball across the room, approximately 10 feet. Patients were divided into teams of 3-5. Instructions required all materials be incorporated into the device, functionality of items left to the peer group's discretion.  Education: Pharmacist, community, Scientist, physiological, Air cabin crew, Building control surveyor.   Education Outcome: Acknowledges education/In group clarification offered/Needs additional education.    Affect/Mood: Flat   Participation Level: Minimal   Participation Quality: Independent   Behavior: Attentive    Speech/Thought Process: Focused   Insight: Moderate   Judgement: Moderate   Modes of Intervention: STEM Activity   Patient Response to Interventions:  Attentive   Education Outcome:  In group clarification offered    Clinical Observations/Individualized Feedback: Pt was quiet and appeared to be shy. Pt was attentive during the activity but also offered minimal suggestions.     Plan: Continue to engage patient in RT group sessions 2-3x/week.   Tyrone Pautsch-McCall, LRT,CTRS 09/27/2023 12:07 PM

## 2023-09-27 NOTE — Group Note (Signed)
Date:  09/27/2023 Time:  9:49 PM  Group Topic/Focus:  Wrap-Up Group:   The focus of this group is to help patients review their daily goal of treatment and discuss progress on daily workbooks.    Participation Level:  Active  Participation Quality:  Appropriate  Affect:  Appropriate  Cognitive:  Appropriate  Insight: Appropriate  Engagement in Group:  Engaged  Modes of Intervention:  Education  Additional Comments:  Patient attended and participated in AA tonight.  Lita Mains [facility] 09/27/2023, 9:49 PM

## 2023-09-27 NOTE — Progress Notes (Signed)
D: Pt alert and oriented. Pt rates depression 0/10 and anxiety 7/10.  Pt denies experiencing any SI/HI, or AVH at this time.   A: Scheduled medications administered to pt, per MD orders. Support and encouragement provided. Frequent verbal contact made. Routine safety checks conducted q15 minutes.   R: No adverse drug reactions noted. Pt verbally contracts for safety at this time. Pt complaint with medications and treatment plan. Pt interacts well with others on the unit. Pt remains safe at this time. Will continue to monitor.

## 2023-09-27 NOTE — Progress Notes (Signed)
   09/27/23 2209  Psych Admission Type (Psych Patients Only)  Admission Status Involuntary  Psychosocial Assessment  Patient Complaints Depression  Eye Contact Fair  Facial Expression Flat  Affect Appropriate to circumstance  Speech Logical/coherent  Interaction Minimal  Motor Activity Other (Comment) (WDL)  Appearance/Hygiene In scrubs  Behavior Characteristics Appropriate to situation  Mood Depressed  Thought Process  Coherency WDL  Content WDL  Delusions None reported or observed  Perception WDL  Hallucination None reported or observed  Judgment Poor  Confusion None  Danger to Self  Current suicidal ideation? Denies  Self-Injurious Behavior No self-injurious ideation or behavior indicators observed or expressed   Agreement Not to Harm Self Yes  Description of Agreement verbal  Danger to Others  Danger to Others None reported or observed

## 2023-09-27 NOTE — Progress Notes (Signed)
   09/27/23 1100  Psych Admission Type (Psych Patients Only)  Admission Status Involuntary  Psychosocial Assessment  Patient Complaints Depression  Eye Contact Fair  Facial Expression Flat  Affect Depressed;Anxious  Speech Logical/coherent  Interaction Minimal  Motor Activity Other (Comment) (wnl)  Appearance/Hygiene In scrubs  Behavior Characteristics Cooperative  Mood Depressed  Thought Process  Coherency WDL  Content WDL  Delusions None reported or observed  Perception WDL  Hallucination None reported or observed  Judgment Poor  Confusion None  Danger to Self  Current suicidal ideation? Denies  Danger to Others  Danger to Others None reported or observed   Pt reports long term thoughts of self injurious behavior. Pt states drinking rubbing alcohol was more to self injure not complete suicide. Pt avoidant of answering questions about recent self harm, did state she engaged in behavior last night but when nurse asked to observe area of injury pt stated " Well I didn't actually do it but the thoughts were really bad but I dont want to be in trouble."
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
## 2023-09-27 NOTE — Plan of Care (Signed)
Problem: Education: Goal: Knowledge of [facility] Education information/materials will improve Outcome: Progressing Goal: Emotional status will improve Outcome: Progressing Goal: Mental status will improve Outcome: Progressing Goal: Verbalization of understanding the information provided will improve Outcome: Progressing   Problem: Activity: Goal: Interest or engagement in activities will improve Outcome: Progressing Goal: Sleeping patterns will improve Outcome: Progressing   Problem: Coping: Goal: Ability to verbalize frustrations and anger appropriately will improve Outcome: Progressing Goal: Ability to demonstrate self-control will improve Outcome: Progressing   Problem: Health Behavior/Discharge Planning: Goal: Identification of resources available to assist in meeting health care needs will improve Outcome: Progressing Goal: Compliance with treatment plan for underlying cause of condition will improve Outcome: Progressing   Problem: Physical Regulation: Goal: Ability to maintain clinical measurements within normal limits will improve Outcome: Progressing   Problem: Safety: Goal: Periods of time without injury will increase Outcome: Progressing

## 2023-09-28 DIAGNOSIS — F332 Major depressive disorder, recurrent severe without psychotic features: Secondary | ICD-10-CM | POA: Diagnosis not present

## 2023-09-28 MED ORDER — ENSURE ENLIVE PO LIQD
237.0000 mL | Freq: Two times a day (BID) | ORAL | Status: DC
  Administered 2023-09-28 – 2023-09-30 (×5): 237 mL via ORAL
  Filled 2023-09-28 (×6): qty 237

## 2023-09-28 NOTE — Progress Notes (Signed)
   09/28/23 0530  15 Minute Checks  Location Bedroom  Visual Appearance Calm  Behavior Sleeping  Sleep (Behavioral Health Patients Only)  Calculate sleep? (Click Yes once per 24 hr at 0600 safety check) Yes  Documented sleep last 24 hours 7.75

## 2023-09-28 NOTE — Plan of Care (Signed)
Problem: Education: Goal: Verbalization of understanding the information provided will improve Outcome: Progressing   Problem: Activity: Goal: Sleeping patterns will improve Outcome: Progressing

## 2023-09-28 NOTE — Plan of Care (Signed)
Problem: Education: Goal: Knowledge of [facility] Education information/materials will improve Outcome: Progressing Goal: Emotional status will improve Outcome: Progressing Goal: Mental status will improve Outcome: Progressing Goal: Verbalization of understanding the information provided will improve Outcome: Progressing   Problem: Activity: Goal: Interest or engagement in activities will improve Outcome: Progressing Goal: Sleeping patterns will improve Outcome: Progressing   Problem: Coping: Goal: Ability to verbalize frustrations and anger appropriately will improve Outcome: Progressing Goal: Ability to demonstrate self-control will improve Outcome: Progressing   Problem: Health Behavior/Discharge Planning: Goal: Identification of resources available to assist in meeting health care needs will improve Outcome: Progressing Goal: Compliance with treatment plan for underlying cause of condition will improve Outcome: Progressing   Problem: Physical Regulation: Goal: Ability to maintain clinical measurements within normal limits will improve Outcome: Progressing   Problem: Safety: Goal: Periods of time without injury will increase Outcome: Progressing

## 2023-09-28 NOTE — Group Note (Signed)
Date:  09/28/2023 Time:  2:01 PM  Group Topic/Focus:  Dimensions of Wellness:   The focus of this group is to introduce the topic of wellness and discuss the role each dimension of wellness plays in total health.   Participation Level:  Active  Participation Quality:  Appropriate  Affect:  Appropriate  Cognitive:  Appropriate  Insight: Appropriate  Engagement in Group:  Engaged  Modes of Intervention:  Discussion and Education  Additional Comments:   Pt attended the Social Wellness group. Pt was quiet, calm and cooperative as MHT provided education of the definition of social wellness, benefits and activities to increase social wellbeing.   Cassandra Cruz 09/28/2023, 2:01 PM

## 2023-09-28 NOTE — Progress Notes (Signed)
   09/28/23 0900  Psych Admission Type (Psych Patients Only)  Admission Status Involuntary  Psychosocial Assessment  Patient Complaints Depression  Eye Contact Fair  Facial Expression Flat  Affect Depressed  Speech Logical/coherent  Interaction Minimal  Motor Activity Slow  Appearance/Hygiene Disheveled;In scrubs  Behavior Characteristics Cooperative  Mood Depressed  Thought Process  Coherency WDL  Content WDL  Delusions None reported or observed  Perception WDL  Hallucination None reported or observed  Judgment Poor  Confusion None  Danger to Self  Current suicidal ideation? Denies  Danger to Others  Danger to Others None reported or observed

## 2023-09-28 NOTE — Group Note (Signed)
Date:  09/28/2023 Time:  9:24 PM  Group Topic/Focus:  Wrap-Up Group:   The focus of this group is to help patients review their daily goal of treatment and discuss progress on daily workbooks.    Participation Level:  None  Participation Quality:  Attentive  Affect:  Appropriate  Cognitive:  Appropriate  Insight: Lacking  Engagement in Group:  Lacking  Modes of Intervention:  Discussion  Additional Comments:  Patient was present during group but did not participate   Alfonse Ras 09/28/2023, 9:24 PM
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
## 2023-09-28 NOTE — Group Note (Signed)
Date:  09/28/2023 Time:  1:19 PM  Group Topic/Focus:  Goals Group:   The focus of this group is to help patients establish daily goals to achieve during treatment and discuss how the patient can incorporate goal setting into their daily lives to aide in recovery. Orientation:   The focus of this group is to educate the patient on the purpose and policies of crisis stabilization and provide a format to answer questions about their admission.  The group details unit policies and expectations of patients while admitted.    Participation Level:  Did Not Attend  Participation Quality:   n/a  Affect:   n/a  Cognitive:   n/a  Insight: None  Engagement in Group:   n/a  Modes of Intervention:   n/a  Additional Comments:   Pt did not attend.  Edmund Hilda Kailin Leu 09/28/2023, 1:19 PM

## 2023-09-29 DIAGNOSIS — F332 Major depressive disorder, recurrent severe without psychotic features: Secondary | ICD-10-CM | POA: Diagnosis not present

## 2023-09-29 NOTE — [facility] Group Notes (Signed)
[facility] LCSW Group Therapy Note  Date/Time:  09/29/2023/ 10:15-11:00  Type of Therapy and Topic:  Group Therapy:  Trust & Honesty   Participation Level:  Active   Description of Group In this group patient will be asked to explore value of being honest. Patient will be guided to discuss their thoughts, feelings, and behaviors related to honesty and trusting in others. Patient will process together how trust and honesty relate to how we form relationships with peers, family member, and self. Each patient will be challenged to identify and express feelings of being vulnerable. Patient will discuss reasons why people are dishonest and identify alternative outcomes if one was truthful to self or others). This group will be process-oriented, with patients participating in explorations of their own experiences as well as giving and receiving support and challenged from other groups members.   Therapeutic Goals Patient will identify why honesty is important to relationships and how honesty overall affects relationships. Patient will identify a situation where they lied or were lied too and the feelings, thought process, and behaviors surrounding the situation. Patient will identify the meaning of being vulnerable, how that feels, and how that correlate to being honest with self and others. Patient will identify situations where they could have told the truth, but instead lied and explain reasons of dishonesty.   Summary of Patient Progress:   Patient share that honesty builds relationship and you can trust that person. Patient share that being dishonest can cut ties and break the relationship.    Therapeutic Modalities Cognitive Behavioral Therapy Motivational Interviewing

## 2023-09-29 NOTE — Progress Notes (Signed)
   09/28/23 2030  [place] Suicide Severity Rating Scale-Reassessment  2. Since last contact: "Have you actually had thoughts about killing yourself?" No  6. Since last contact: "Have you done anything, started to do anything, or prepared to do anything to end your life?" No  C-SSRS Reassessment Risk Category Score No Risk  [facility] Suicide Reassessment Precaution Interventions  [facility] Suicide Reassessment Bundle Interventions Low Risk Interventions implemented  Danger to Self  Current suicidal ideation? Denies  Self-Injurious Behavior No self-injurious ideation or behavior indicators observed or expressed   Agreement Not to Harm Self Yes  Description of Agreement Verbal

## 2023-09-29 NOTE — Progress Notes (Addendum)
D. Pt presents with a flat affect, depressed mood- has been calm and cooperative-minimal during interactions. Pt did report that her anxiety has improved,and rated her depression,hopelessness and anxiety a 5/5/3, respectively. Pt described her appetite as 'fair', concentration  and sleep as 'good', and energy level as 'normal'. Pt has been visible in the milieu, observed attending groups.. Pt currently denies SI/HI and AVH.  A. Labs and vitals monitored. Pt given and educated on medications. Pt supported emotionally and encouraged to express concerns and ask questions.   R. Pt remains safe with 15 minute checks. Will continue POC.    09/29/23 1500  Psych Admission Type (Psych Patients Only)  Admission Status Involuntary  Psychosocial Assessment  Patient Complaints Depression  Eye Contact Fair  Facial Expression Flat  Affect Depressed  Speech Logical/coherent  Interaction Minimal  Motor Activity Slow  Appearance/Hygiene Disheveled  Behavior Characteristics Cooperative;Guarded;Calm  Mood Depressed  Thought Process  Coherency WDL  Content WDL  Delusions None reported or observed  Perception WDL  Hallucination None reported or observed  Judgment Impaired  Confusion None  Danger to Self  Current suicidal ideation? Denies  Self-Injurious Behavior No self-injurious ideation or behavior indicators observed or expressed   Danger to Others  Danger to Others None reported or observed

## 2023-09-29 NOTE — Plan of Care (Signed)
?  Problem: Activity: ?Goal: Interest or engagement in activities will improve ?Outcome: Progressing ?  ?Problem: Coping: ?Goal: Ability to demonstrate self-control will improve ?Outcome: Progressing ?  ?Problem: Physical Regulation: ?Goal: Ability to maintain clinical measurements within normal limits will improve ?Outcome: Progressing ?  ?

## 2023-09-29 NOTE — Group Note (Signed)
Date:  09/29/2023 Time:  9:30 PM  Group Topic/Focus:  Wrap-Up Group:   The focus of this group is to help patients review their daily goal of treatment and discuss progress on daily workbooks.    Participation Level:  None  Participation Quality:  Inattentive  Affect:  Flat  Cognitive:  Lacking  Insight: Lacking  Engagement in Group:  Lacking  Modes of Intervention:  Discussion  Additional Comments:  Patient was present during group but did not participate  Alfonse Ras 09/29/2023, 9:30 PM

## 2023-09-29 NOTE — Plan of Care (Signed)
  Problem: Education: Goal: Emotional status will improve Outcome: Progressing Goal: Mental status will improve Outcome: Progressing "Negative thoughts are getting better"
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
## 2023-09-29 NOTE — Group Note (Signed)
Date:  09/29/2023 Time:  11:46 AM  Group Topic/Focus:  Coping With Mental Health Crisis:   The purpose of this group is to help patients identify strategies for coping with mental health crisis.  Group discusses possible causes of crisis and ways to manage them effectively.    Participation Level:  Minimal  Participation Quality:  Appropriate  Affect:  Appropriate  Cognitive:  Appropriate  Insight: Appropriate  Engagement in Group:  Developing/Improving  Modes of Intervention:  Education  Additional Comments:    Arnoldo Hooker 09/29/2023, 11:46 AM

## 2023-09-29 NOTE — Progress Notes (Signed)
   09/29/23 2210  Psych Admission Type (Psych Patients Only)  Admission Status Involuntary  Psychosocial Assessment  Patient Complaints Depression  Eye Contact Fair  Facial Expression Flat  Affect Appropriate to circumstance  Speech Logical/coherent  Interaction Minimal  Motor Activity Other (Comment) (WDL)  Appearance/Hygiene Disheveled  Behavior Characteristics Appropriate to situation  Mood Depressed  Thought Process  Coherency WDL  Content WDL  Delusions None reported or observed  Perception WDL  Hallucination None reported or observed  Judgment Impaired  Confusion None  Danger to Self  Current suicidal ideation? Denies  Self-Injurious Behavior No self-injurious ideation or behavior indicators observed or expressed   Agreement Not to Harm Self Yes  Description of Agreement verbal  Danger to Others  Danger to Others None reported or observed

## 2023-09-30 MED ORDER — VITAMIN D3 25 MCG PO TABS: 2000.0000 [IU] | ORAL_TABLET | Freq: Every day | ORAL | Status: AC

## 2023-09-30 MED ORDER — HYDROXYZINE HCL 25 MG PO TABS: 25.0000 mg | ORAL_TABLET | Freq: Three times a day (TID) | ORAL | 0 refills | Status: AC | PRN

## 2023-09-30 MED ORDER — MIRTAZAPINE 15 MG PO TABS: 15.0000 mg | ORAL_TABLET | Freq: Every day | ORAL | 0 refills | Status: AC

## 2023-09-30 MED ORDER — TRAZODONE HCL 50 MG PO TABS: 50.0000 mg | ORAL_TABLET | Freq: Every evening | ORAL | 0 refills | Status: AC | PRN

## 2023-09-30 MED ORDER — BUSPIRONE HCL 15 MG PO TABS: 15.0000 mg | ORAL_TABLET | Freq: Three times a day (TID) | ORAL | 0 refills | Status: AC

## 2023-09-30 MED ORDER — ENSURE ENLIVE PO LIQD: ORAL | Status: AC

## 2023-09-30 MED ORDER — SERTRALINE HCL 100 MG PO TABS: 100.0000 mg | ORAL_TABLET | Freq: Every day | ORAL | 0 refills | Status: AC

## 2023-09-30 MED ORDER — VITAMIN D (ERGOCALCIFEROL) 1.25 MG (50000 UNIT) PO CAPS: 50000.0000 [IU] | ORAL_CAPSULE | ORAL | 0 refills | Status: AC

## 2023-09-30 NOTE — Progress Notes (Signed)
   09/30/23 0528  15 Minute Checks  Location Bedroom  Visual Appearance Calm  Behavior Sleeping  Sleep (Behavioral Health Patients Only)  Calculate sleep? (Click Yes once per 24 hr at 0600 safety check) Yes  Documented sleep last 24 hours 8.75

## 2023-09-30 NOTE — Progress Notes (Signed)
D: Pt A & O X 4. Denies SI, HI, AVH and pain at this time. D/C home as ordered. Picked up in lobby by by her mother. A: D/C instructions reviewed with pt including electronic prescriptions and follow up appointments; compliance encouraged. All belongings from locker 1 (deodorant) given to pt at time of departure. Scheduled medications given with verbal education and effects monitored. Safety checks maintained without incident till time of d/c.  R: Pt receptive to care. Compliant with medications when offered. Denies adverse drug reactions when assessed. Verbalized understanding related to d/c instructions. Signed belonging sheet in agreement with item received from locker. Ambulatory with a steady gait. Appears to be in no physical distress at time of departure.

## 2023-09-30 NOTE — Plan of Care (Signed)
Goals met, Pt d/c.

## 2023-09-30 NOTE — Plan of Care (Signed)
Problem: Education: Goal: Emotional status will improve Outcome: Progressing   Problem: Activity: Goal: Sleeping patterns will improve Outcome: Progressing

## 2023-09-30 NOTE — Progress Notes (Signed)
  [facility] Adult Case Management Discharge Plan :  Will you be returning to the same living situation after discharge:  Yes,  pt will be returning home at discharge At discharge, do you have transportation home?: Yes,  pt will be picked up by mom at 11:30AM Do you have the ability to pay for your medications: Yes,  pt has active [facility] PHP  Release of information consent forms completed and in the chart;  Patient's signature needed at discharge.  Patient to Follow up at:  Follow-up Information     [facility]. Go to.   Specialty: Behavioral Health Why: Please go to this provider for medication management services. You may call to schedule an appointment or go Monday through Friday, arrive at 7:00 am. Contact information: [address] [place] [address] [phone]        [facility]. Go to.   Specialty: Professional Counselor Why: Please go to this provider for therapy services on Monday through Friday, 9 am to 1 pm for an initial assessment. Contact information: [address] [place] [address] [phone]         [facility] Follow up.   Why: Please have the patient or guardian call us at [phone] between 8am - 3pm Monday - Friday to complete initial registration and assessment virtuall through our open access process. The patient can also walk-in to our nearest behavioral health outpatient office if preferred. If walking in, note that our offices are closed for lunch from 12-1pm. Please share the following with the patient when instructing them to call/walk-in to [employer] information: [address]  [address] [place] [address] [phone]         [facility] Follow up on 10/01/2023.   Specialty: Behavioral Health Why: You are scheduled for an assessment for the PHP on Tuesday, 1/28 at 10:00 am. This appointment will last approximately one hour and  will be virtual via [employer] Please download the Teams app prior to the appointment. You will receive a link via email to join the meeting and will click on the link to connect. If your appointment is scheduled as a MyChart video visit, please do not join that way, as we need to ensure you are able to use Teams for group.  If you need to cancel or reschedule, please call [phone] and leave a voicemail with your name, date of birth, and phone number Contact information: [address] [place] [address] [phone]                Next level of care provider has access to [facility] Link:no  Safety Planning and Suicide Prevention discussed: Yes,  Blair Heys, [phone] (mom)    Has patient been referred to the Quitline?: Patient does not use tobacco/nicotine products  Patient has been referred for addiction treatment: No known substance use disorder.  Kathi Der, LCSWA 09/30/2023, 9:01 AM

## 2023-10-01 ENCOUNTER — Ambulatory Visit (INDEPENDENT_AMBULATORY_CARE_PROVIDER_SITE_OTHER): Payer: Medicaid Other | Admitting: Licensed Clinical Social Worker

## 2023-10-01 DIAGNOSIS — F332 Major depressive disorder, recurrent severe without psychotic features: Secondary | ICD-10-CM

## 2023-10-01 NOTE — Progress Notes (Signed)
   10/01/23 1037  Step One: Remove Access  What things should I remove or limit my access to? Get rid of pills I don't need, keep only quantities that are not dangerous;Temporarily store all guns with a friend, relative, gun shop or storage facility, ask someone to hold on to thekeys to my gun locks or gun safe.  What are some ways I can get rid of pills? My doctor, pharmacist, or other healthcare professionals can advise me.;Have someone else hold my pills  What might I use to hurt myself? pills  What other actions can I take to limit ways that I can hurt myself? Have someone hold onto medications and give them to pt when it's time  Step Two: Warning Signs  What are my specific triggers or warning signs a crisis is developing? Feeling anxious, agitated;Feeling overwhelmed by life circumstances, e.g, finances;Feeling hopeless things won't ever get better  Step Three: Distracting Activities  What engaging and calming activities can I do to distract myself? Listen to music;Go for a walk, exercise;Other: (call friends)  Step Four: Distracting People and Places  Who helps me to take my mind off of my problems? friends, Trula Ore and Ellan Lambert  Where can I go to take my mind off my problems? can go to a store and walk around  Step Five: Social Support  Which family members or friends can I talk to to help me feel better? friends, Trula Ore and [place]. Doesn't feel comfortable talking to family.  Who can help me get through the crisis? friends, Trula Ore and [place]. Would bring in family if it was a crisis.  Who is supportive? friends, Trula Ore and Ellan Lambert  Step Six: Professional Help  Where can I get help? [facility], ED, 911, 988, mobile crisis

## 2023-10-02 ENCOUNTER — Telehealth (HOSPITAL_COMMUNITY): Payer: Self-pay | Admitting: Licensed Clinical Social Worker

## 2023-10-02 NOTE — Progress Notes (Signed)
   10/01/23 1600  Patient-Centered Profile  PCP Completed On 10/01/23  Plan Meeting Date 10/01/23  Effective Date 10/01/23  What People Like and [employer] [facility] About? I'd say I have somewhat self-control. I try to be respectful, I try to be a good listener."  What's Important to? "Having more motivation to do stuff and helping my anxiety."  How Best to Support? Chauntel will engage in PHP Monday-Friday from 9 am - 1 pm for approximately three weeks and then step down to outpatient therapy and medication management.  Add What's Working / [employer] Not Working? "I don't really know."  PCP Action Plan  Long Range Outcome Joyann will report increased ability to manage depression and anxiety symptoms using coping skills at least 3x a day. Pt will report decrease in passive SI to at least 0x a week. Pt will report increased ability to catch, challenge, change cognitive distortions at least 3x a day.
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
## 2023-10-03 ENCOUNTER — Telehealth (HOSPITAL_COMMUNITY): Payer: Self-pay | Admitting: Licensed Clinical Social Worker

## 2023-10-03 ENCOUNTER — Encounter (HOSPITAL_COMMUNITY): Payer: Self-pay

## 2023-10-03 ENCOUNTER — Ambulatory Visit (HOSPITAL_COMMUNITY): Payer: Medicaid Other

## 2023-10-04 ENCOUNTER — Ambulatory Visit (HOSPITAL_COMMUNITY): Payer: Medicaid Other

## 2023-10-04 ENCOUNTER — Encounter (HOSPITAL_COMMUNITY): Payer: Self-pay

## 2023-10-07 ENCOUNTER — Telehealth (HOSPITAL_COMMUNITY): Payer: Self-pay | Admitting: Licensed Clinical Social Worker

## 2023-10-07 ENCOUNTER — Encounter (HOSPITAL_COMMUNITY): Payer: Self-pay

## 2023-10-07 ENCOUNTER — Ambulatory Visit (HOSPITAL_COMMUNITY): Payer: Medicaid Other

## 2023-10-08 ENCOUNTER — Ambulatory Visit (HOSPITAL_COMMUNITY): Payer: Medicaid Other

## 2023-10-08 ENCOUNTER — Encounter (HOSPITAL_COMMUNITY): Payer: Self-pay

## 2023-10-09 ENCOUNTER — Ambulatory Visit (HOSPITAL_COMMUNITY): Payer: Medicaid Other

## 2023-10-10 ENCOUNTER — Ambulatory Visit (HOSPITAL_COMMUNITY): Payer: Medicaid Other

## 2023-10-11 ENCOUNTER — Ambulatory Visit (HOSPITAL_COMMUNITY): Payer: Medicaid Other

## 2023-10-14 ENCOUNTER — Ambulatory Visit (HOSPITAL_COMMUNITY): Payer: Medicaid Other

## 2023-10-15 ENCOUNTER — Ambulatory Visit (HOSPITAL_COMMUNITY): Payer: Medicaid Other

## 2023-10-16 ENCOUNTER — Ambulatory Visit (HOSPITAL_COMMUNITY): Payer: Medicaid Other

## 2023-10-17 ENCOUNTER — Ambulatory Visit (HOSPITAL_COMMUNITY): Payer: Medicaid Other

## 2023-10-18 ENCOUNTER — Ambulatory Visit (HOSPITAL_COMMUNITY): Payer: Medicaid Other

## 2023-10-21 ENCOUNTER — Ambulatory Visit (HOSPITAL_COMMUNITY): Payer: Medicaid Other

## 2023-10-22 ENCOUNTER — Ambulatory Visit (HOSPITAL_COMMUNITY): Payer: Medicaid Other

## 2023-10-23 ENCOUNTER — Ambulatory Visit (HOSPITAL_COMMUNITY): Payer: Medicaid Other

## 2024-02-25 ENCOUNTER — Emergency Department (HOSPITAL_COMMUNITY)

## 2024-02-25 ENCOUNTER — Encounter (HOSPITAL_COMMUNITY): Payer: Self-pay

## 2024-02-25 ENCOUNTER — Emergency Department (HOSPITAL_COMMUNITY)
Admission: EM | Admit: 2024-02-25 | Discharge: 2024-02-26 | Disposition: A | Discharge status: Home / Self Care | Attending: Emergency Medicine | Admitting: Emergency Medicine

## 2024-02-25 DIAGNOSIS — N390 Urinary tract infection, site not specified: Secondary | ICD-10-CM | POA: Diagnosis not present

## 2024-02-25 DIAGNOSIS — R300 Dysuria: Secondary | ICD-10-CM | POA: Diagnosis present

## 2024-02-25 HISTORY — DX: Depression, unspecified: F32.A

## 2024-02-25 LAB — URINALYSIS, ROUTINE W REFLEX MICROSCOPIC

## 2024-02-25 LAB — URINALYSIS, MICROSCOPIC (REFLEX)
RBC / HPF: >50 RBC/hpf (ref 0–5)
WBC, UA: >50 WBC/hpf (ref 0–5)

## 2024-02-25 LAB — I-STAT CHEM 8, ED
BUN: 10 mg/dL (ref 6–20)
Calcium, Ion: 1.23 mmol/L (ref 1.15–1.40)
Chloride: 103 mmol/L (ref 98–111)
Creatinine, Ser: 0.7 mg/dL (ref 0.44–1.00)
Glucose, Bld: 88 mg/dL (ref 70–99)
HCT: 37 % (ref 36.0–46.0)
Hemoglobin: 12.6 g/dL (ref 12.0–15.0)
Potassium: 3.9 mmol/L (ref 3.5–5.1)
Sodium: 139 mmol/L (ref 135–145)
TCO2: 25 mmol/L (ref 22–32)

## 2024-02-25 LAB — PREGNANCY, URINE: Preg Test, Ur: NEGATIVE

## 2024-02-25 MED ORDER — PHENAZOPYRIDINE HCL 200 MG PO TABS: 200.0000 mg | ORAL_TABLET | Freq: Three times a day (TID) | ORAL | 0 refills | Status: AC

## 2024-02-25 MED ORDER — PHENAZOPYRIDINE HCL 200 MG PO TABS: 200.0000 mg | ORAL_TABLET | Freq: Three times a day (TID) | ORAL | 0 refills | Status: DC

## 2024-02-25 MED ORDER — IOHEXOL 300 MG/ML  SOLN
100.0000 mL | Freq: Once | INTRAMUSCULAR | Status: AC | PRN
  Administered 2024-02-25: 80 mL via INTRAVENOUS

## 2024-02-25 MED ORDER — CEPHALEXIN 500 MG PO CAPS: 500.0000 mg | ORAL_CAPSULE | Freq: Three times a day (TID) | ORAL | 0 refills | Status: DC

## 2024-02-25 MED ORDER — CEPHALEXIN 500 MG PO CAPS
500.0000 mg | ORAL_CAPSULE | Freq: Once | ORAL | Status: AC
  Administered 2024-02-26: 500 mg via ORAL
  Filled 2024-02-25: qty 1

## 2024-02-25 MED ORDER — CEPHALEXIN 500 MG PO CAPS: 500.0000 mg | ORAL_CAPSULE | Freq: Three times a day (TID) | ORAL | 0 refills | Status: AC

## 2024-02-25 NOTE — ED Triage Notes (Signed)
 Pt c/o dysuria, hematuria, and odorous urine starting today.

## 2024-02-25 NOTE — ED Provider Notes (Signed)
 [place] EMERGENCY DEPARTMENT AT [facility] Provider Note   CSN: [id number] Arrival date & time: 02/25/24  8162     Patient presents with: Dysuria and Hematuria   Cassandra Cruz is a 23 y.o. female with no significant past medical history presents the ED today for dysuria and hematuria.  Patient reports intermittent pain with urination for the past several weeks.  States that as of today she started bright red blood in her urine and then started to have to it as well.  Denies any abdominal or flank pain. No fevers, nausea, vomiting, or changes to bowel habits.  No vaginal discharge or bleeding.  Patient denies sexual activity.  She is not concerned for any STDs.     Prior to Admission medications   Medication Sig Start Date End Date Taking? Authorizing Provider  busPIRone  (BUSPAR ) 15 MG tablet Take 1 tablet (15 mg total) by mouth 3 (three) times daily. 09/30/23   McCarty, Artie, MD  cephALEXin (KEFLEX) 500 MG capsule Take 1 capsule (500 mg total) by mouth 3 (three) times daily for 5 days. 02/25/24 03/01/24  Waddell Sluder, PA-C  feeding supplement (ENSURE ENLIVE / ENSURE PLUS) LIQD Recommend having Ensure or similar drinks at least twice a day to support diet, especially when only having a one meal a day. There are recipes to make your own meal replacement shakes to save money if that is barrier. Unfortunately insurance does not cover these drinks. 09/30/23   Cornelius Dines, MD  hydrOXYzine  (ATARAX ) 25 MG tablet Take 1 tablet (25 mg total) by mouth 3 (three) times daily as needed for anxiety. 09/30/23   Cornelius Dines, MD  mirtazapine  (REMERON ) 15 MG tablet Take 1 tablet (15 mg total) by mouth at bedtime. 09/30/23   McCarty, Artie, MD  phenazopyridine (PYRIDIUM) 200 MG tablet Take 1 tablet (200 mg total) by mouth 3 (three) times daily for 5 days. 02/25/24 03/01/24  Waddell Sluder, PA-C  sertraline  (ZOLOFT ) 100 MG tablet Take 1 tablet (100 mg total) by mouth daily. 09/30/23   Cornelius Dines, MD  traZODone  (DESYREL ) 50 MG tablet Take 1 tablet (50 mg total) by mouth at bedtime as needed for sleep. 09/30/23   Cornelius Dines, MD  Vitamin D , Ergocalciferol , (DRISDOL ) 1.25 MG (50000 UNIT) CAPS capsule Take 1 capsule (50,000 Units total) by mouth every 7 (seven) days. 09/30/23   McCarty, Artie, MD  vitamin D3 (CHOLECALCIFEROL ) 25 MCG tablet Take 2-5 tablets (2,000-5,000 Units total) by mouth daily. 09/30/23   Cornelius Dines, MD    Allergies: Patient has no known allergies.    Review of Systems  Genitourinary:  Positive for dysuria and hematuria.  All other systems reviewed and are negative.   Updated Vital Signs BP 139/81 (BP Location: Left Arm)   Pulse (!) 110   Temp 98.6 F (37 C) (Oral)   Resp 16   Ht 5' 1 (1.549 m)   Wt 37.6 kg   SpO2 99%   BMI 15.68 kg/m   Physical Exam Vitals and nursing note reviewed.  Constitutional:      General: She is not in acute distress.    Appearance: Normal appearance.  HENT:     Head: Normocephalic and atraumatic.     Mouth/Throat:     Mouth: Mucous membranes are moist.   Eyes:     Conjunctiva/sclera: Conjunctivae normal.     Pupils: Pupils are equal, round, and reactive to light.    Cardiovascular:     Rate and Rhythm: Normal  rate and regular rhythm.     Pulses: Normal pulses.     Heart sounds: Normal heart sounds.  Pulmonary:     Effort: Pulmonary effort is normal.     Breath sounds: Normal breath sounds.  Abdominal:     Palpations: Abdomen is soft.     Tenderness: There is no abdominal tenderness. There is no right CVA tenderness or left CVA tenderness.   Musculoskeletal:        General: Normal range of motion.     Cervical back: Normal range of motion.   Skin:    General: Skin is warm and dry.     Findings: No rash.   Neurological:     General: No focal deficit present.     Mental Status: She is alert.   Psychiatric:        Mood and Affect: Mood normal.        Behavior: Behavior normal.    (all labs  ordered are listed, but only abnormal results are displayed) Labs Reviewed  URINALYSIS, ROUTINE W REFLEX MICROSCOPIC - Abnormal; Notable for the following components:      Result Value   Color, Urine RED (*)    APPearance TURBID (*)    Glucose, UA   (*)    Value: TEST NOT REPORTED DUE TO COLOR INTERFERENCE OF URINE PIGMENT   Hgb urine dipstick   (*)    Value: TEST NOT REPORTED DUE TO COLOR INTERFERENCE OF URINE PIGMENT   Bilirubin Urine   (*)    Value: TEST NOT REPORTED DUE TO COLOR INTERFERENCE OF URINE PIGMENT   Ketones, ur   (*)    Value: TEST NOT REPORTED DUE TO COLOR INTERFERENCE OF URINE PIGMENT   Protein, ur   (*)    Value: TEST NOT REPORTED DUE TO COLOR INTERFERENCE OF URINE PIGMENT   Nitrite   (*)    Value: TEST NOT REPORTED DUE TO COLOR INTERFERENCE OF URINE PIGMENT   Leukocytes,Ua   (*)    Value: TEST NOT REPORTED DUE TO COLOR INTERFERENCE OF URINE PIGMENT   All other components within normal limits  URINALYSIS, MICROSCOPIC (REFLEX) - Abnormal; Notable for the following components:   Bacteria, UA MANY (*)    All other components within normal limits  PREGNANCY, URINE  I-STAT CHEM 8, ED    EKG: None  Radiology: CT ABDOMEN PELVIS W CONTRAST Result Date: 02/25/2024 CLINICAL DATA:  Abdominal and flank pain, stone suspected. Hematuria and dysuria with malodorous urine. EXAM: CT ABDOMEN AND PELVIS WITH CONTRAST TECHNIQUE: Multidetector CT imaging of the abdomen and pelvis was performed using the standard protocol following bolus administration of intravenous contrast. RADIATION DOSE REDUCTION: This exam was performed according to the departmental dose-optimization program which includes automated exposure control, adjustment of the mA and/or kV according to patient size and/or use of iterative reconstruction technique. CONTRAST:  80mL OMNIPAQUE IOHEXOL 300 MG/ML  SOLN COMPARISON:  None. FINDINGS: Lower chest: No abnormality. Hepatobiliary: The liver is 18 cm length with mild  steatosis without mass enhancement. Gallbladder and bile ducts are unremarkable. Pancreas: No abnormality. Spleen: No abnormality.  No splenomegaly. Adrenals/Urinary Tract: There is no mass enhancement of the adrenals and kidneys. The bilateral renal cortex enhanced homogeneously. There is no urinary stone or obstruction. There is mild generalized thickening of the bladder wall concerning for cystitis. There is no masslike thickening. Stomach/Bowel: No dilatation or overt wall thickening including the appendix. Vascular/Lymphatic: Pelvic venous congestion is seen bilaterally. No other significant vascular findings.  No portal vein dilatation or thrombosis. Normal aorta and branch vessels. Reproductive: Uterus and bilateral adnexa are unremarkable. Other: There is minimal pelvic cul-de-sac and left adnexal low-density fluid, typically is physiologic at this age. No free hemorrhage, free air, or focal inflammatory process. There are no incarcerated hernias. Musculoskeletal: Mild lower thoracic/lumbar levorotary scoliosis, apex L2. No acute or other significant osseous findings. IMPRESSION: 1. Mild generalized bladder wall thickening concerning for cystitis. 2. No urinary stone or obstruction. 3. Mild hepatic steatosis. 4. Pelvic venous congestion. 5. Minimal pelvic cul-de-sac and left adnexal low-density fluid, typically physiologic at this age. Electronically Signed   By: Francis Quam M.D.   On: 02/25/2024 23:44     Procedures   Medications Ordered in the ED  cephALEXin (KEFLEX) capsule 500 mg (has no administration in time range)  iohexol (OMNIPAQUE) 300 MG/ML solution 100 mL (80 mLs Intravenous Contrast Given 02/25/24 2308)                                    Medical Decision Making Amount and/or Complexity of Data Reviewed Labs: ordered. Radiology: ordered.  Risk Prescription drug management.   This patient presents to the ED for concern of dysuria and hematuria, this involves an extensive  number of treatment options, and is a complaint that carries with it a high risk of complications and morbidity.   Differential diagnosis includes: UTI, pyelonephritis, kidney stone, etc.   Comorbidities  See HPI above   Additional History  Additional history obtained from prior records   Lab Tests  I ordered and personally interpreted labs.  The pertinent results include:   iStat Chem-8 is unremarkable  Negative urine pregnancy test UA shows red and turbid urine, unable to assess for leukocytes and nitrates due to pigmentation.   Imaging Studies  I ordered imaging studies including CT abdomen/pelvis  I independently visualized and interpreted imaging which showed:  Bladder wall thickening consistent with cystitis.  No urinary stent obstruction. Hepatic steatosis. Pelvic venous congestion. Minimal pelvic cul-de-sac and left adnexal low-density fluid, typical at this age. I agree with the radiologist interpretation   Problem List / ED Course / Critical Interventions / Medication Management  Patient reports intermittent concerns of dysuria for the past several weeks today she noticed bright red blood in her urine and that it was malodorous.  Denies any abdominal pain or flank pain.  No fevers.  No nausea, vomiting, diarrhea. Denies sexual activity.  No vaginal bleeding or discharge. Mother at bedside states that there is a family history of kidney stones.  Will order imaging for further evaluation since there is bright red blood obscuring urine sample. First dose of Keflex given in the ED tonight.  Prescription sent to the pharmacy for the rest the antibiotic as well as Pyridium. Discussed findings and incidental findings with patient and mother at bedside.  All questions answered.   Social Determinants of Health  Access to healthcare   Test / Admission - Considered  Patient is stable and safe for discharge home. Return precautions given.    Final diagnoses:   Lower urinary tract infectious disease    ED Discharge Orders          Ordered    cephALEXin (KEFLEX) 500 MG capsule  3 times daily,   Status:  Discontinued        02/25/24 2347    phenazopyridine (PYRIDIUM) 200 MG tablet  3 times daily,  Status:  Discontinued        02/25/24 2347    cephALEXin (KEFLEX) 500 MG capsule  3 times daily        02/25/24 2352    phenazopyridine (PYRIDIUM) 200 MG tablet  3 times daily        02/25/24 2352               Waddell Sluder, PA-C 02/25/24 2357    Patsey Lot, MD 02/26/24 0003

## 2024-02-25 NOTE — Discharge Instructions (Signed)
 You have a UTI.  Take Keflex 3 times a day for the next 5 days. It takes about 2-3 days for antibiotics to kick in. Take Pyridium every 8 hours as needed for urinary discomfort - this medication can cause urine to be a bright orange/red color, this is normal.  Follow-up with your PCP in the next 5 to 7 days for reevaluation.  Get help right away if: You have very bad pain in your back or lower belly. You faint.
